# Patient Record
Sex: Male | Born: 1960 | Race: White | Hispanic: No | Marital: Married | Smoking: Current some day smoker
Health system: Southern US, Community
[De-identification: ages and names within clinical notes are randomized; demographics above are authoritative.]

## PROBLEM LIST (undated history)

## (undated) DIAGNOSIS — R55 Syncope and collapse: Secondary | ICD-10-CM

## (undated) DIAGNOSIS — N200 Calculus of kidney: Secondary | ICD-10-CM

## (undated) HISTORY — DX: Syncope and collapse: R55

## (undated) HISTORY — PX: LEG SURGERY: SHX1003

## (undated) HISTORY — PX: KNEE ARTHROSCOPY: SHX127

---

## 2002-09-25 ENCOUNTER — Emergency Department (HOSPITAL_COMMUNITY): Admission: EM | Admit: 2002-09-25 | Discharge: 2002-09-25 | Payer: Self-pay | Admitting: Emergency Medicine

## 2003-01-03 ENCOUNTER — Emergency Department (HOSPITAL_COMMUNITY): Admission: EM | Admit: 2003-01-03 | Discharge: 2003-01-03 | Payer: Self-pay | Admitting: Emergency Medicine

## 2003-08-25 ENCOUNTER — Emergency Department (HOSPITAL_COMMUNITY): Admission: EM | Admit: 2003-08-25 | Discharge: 2003-08-25 | Payer: Self-pay | Admitting: Emergency Medicine

## 2005-04-22 ENCOUNTER — Emergency Department (HOSPITAL_COMMUNITY): Admission: EM | Admit: 2005-04-22 | Discharge: 2005-04-22 | Payer: Self-pay | Admitting: Emergency Medicine

## 2006-07-18 ENCOUNTER — Emergency Department (HOSPITAL_COMMUNITY): Admission: EM | Admit: 2006-07-18 | Discharge: 2006-07-18 | Payer: Self-pay | Admitting: Family Medicine

## 2008-04-14 ENCOUNTER — Emergency Department (HOSPITAL_COMMUNITY): Admission: EM | Admit: 2008-04-14 | Discharge: 2008-04-14 | Payer: Self-pay | Admitting: Emergency Medicine

## 2008-12-23 ENCOUNTER — Ambulatory Visit: Payer: Self-pay | Admitting: Diagnostic Radiology

## 2008-12-23 ENCOUNTER — Emergency Department (HOSPITAL_BASED_OUTPATIENT_CLINIC_OR_DEPARTMENT_OTHER): Admission: EM | Admit: 2008-12-23 | Discharge: 2008-12-23 | Payer: Self-pay | Admitting: Emergency Medicine

## 2009-01-03 ENCOUNTER — Ambulatory Visit (HOSPITAL_COMMUNITY): Admission: RE | Admit: 2009-01-03 | Discharge: 2009-01-03 | Payer: Self-pay | Admitting: Orthopedic Surgery

## 2010-05-06 ENCOUNTER — Ambulatory Visit: Payer: Self-pay | Admitting: Interventional Radiology

## 2010-05-06 ENCOUNTER — Emergency Department (HOSPITAL_BASED_OUTPATIENT_CLINIC_OR_DEPARTMENT_OTHER): Admission: EM | Admit: 2010-05-06 | Discharge: 2010-05-06 | Payer: Self-pay | Admitting: Emergency Medicine

## 2010-11-08 LAB — CBC
HCT: 49.3 % (ref 39.0–52.0)
Hemoglobin: 16.8 g/dL (ref 13.0–17.0)
MCH: 32.8 pg (ref 26.0–34.0)
MCHC: 33.9 g/dL (ref 30.0–36.0)
MCV: 96.5 fL (ref 78.0–100.0)
Platelets: 224 10*3/uL (ref 150–400)
RBC: 5.11 MIL/uL (ref 4.22–5.81)
RDW: 12.4 % (ref 11.5–15.5)
WBC: 6.6 10*3/uL (ref 4.0–10.5)

## 2010-11-08 LAB — POCT CARDIAC MARKERS
CKMB, poc: <1 ng/mL — ABNORMAL LOW (ref 1.0–8.0)
CKMB, poc: <1 ng/mL — ABNORMAL LOW (ref 1.0–8.0)
Myoglobin, poc: 45.6 ng/mL (ref 12–200)
Myoglobin, poc: 68.3 ng/mL (ref 12–200)
Troponin i, poc: <0.05 ng/mL (ref 0.00–0.09)
Troponin i, poc: <0.05 ng/mL (ref 0.00–0.09)

## 2010-11-08 LAB — COMPREHENSIVE METABOLIC PANEL
ALT: 23 U/L (ref 0–53)
AST: 25 U/L (ref 0–37)
Albumin: 4.5 g/dL (ref 3.5–5.2)
Alkaline Phosphatase: 114 U/L (ref 39–117)
BUN: 12 mg/dL (ref 6–23)
CO2: 28 mEq/L (ref 19–32)
Calcium: 10 mg/dL (ref 8.4–10.5)
Chloride: 104 mEq/L (ref 96–112)
Creatinine, Ser: 1.1 mg/dL (ref 0.4–1.5)
GFR calc Af Amer: >60 mL/min (ref >60)
GFR calc non Af Amer: >60 mL/min (ref >60)
Glucose, Bld: 137 mg/dL — ABNORMAL HIGH (ref 70–99)
Potassium: 4.1 mEq/L (ref 3.5–5.1)
Sodium: 142 mEq/L (ref 135–145)
Total Bilirubin: 1 mg/dL (ref 0.3–1.2)
Total Protein: 7.7 g/dL (ref 6.0–8.3)

## 2010-11-08 LAB — DIFFERENTIAL
Basophils Absolute: 0.2 10*3/uL — ABNORMAL HIGH (ref 0.0–0.1)
Basophils Relative: 2 % — ABNORMAL HIGH (ref 0–1)
Eosinophils Absolute: 0.3 10*3/uL (ref 0.0–0.7)
Eosinophils Relative: 4 % (ref 0–5)
Lymphocytes Relative: 26 % (ref 12–46)
Lymphs Abs: 1.7 10*3/uL (ref 0.7–4.0)
Monocytes Absolute: 0.4 10*3/uL (ref 0.1–1.0)
Monocytes Relative: 5 % (ref 3–12)
Neutro Abs: 4 10*3/uL (ref 1.7–7.7)
Neutrophils Relative %: 62 % (ref 43–77)

## 2010-11-08 LAB — URINALYSIS, ROUTINE W REFLEX MICROSCOPIC
Bilirubin Urine: NEGATIVE
Glucose, UA: NEGATIVE mg/dL
Hgb urine dipstick: NEGATIVE
Ketones, ur: NEGATIVE mg/dL
Nitrite: NEGATIVE
Protein, ur: NEGATIVE mg/dL
Specific Gravity, Urine: 1.005 (ref 1.005–1.030)
Urobilinogen, UA: 0.2 mg/dL (ref 0.0–1.0)
pH: 6.5 (ref 5.0–8.0)

## 2011-09-22 ENCOUNTER — Encounter (HOSPITAL_BASED_OUTPATIENT_CLINIC_OR_DEPARTMENT_OTHER): Payer: Self-pay | Admitting: *Deleted

## 2011-09-22 ENCOUNTER — Emergency Department (HOSPITAL_BASED_OUTPATIENT_CLINIC_OR_DEPARTMENT_OTHER)
Admission: EM | Admit: 2011-09-22 | Discharge: 2011-09-22 | Disposition: A | Discharge status: Home / Self Care | Payer: 59 | Attending: Emergency Medicine | Admitting: Emergency Medicine

## 2011-09-22 ENCOUNTER — Emergency Department (INDEPENDENT_AMBULATORY_CARE_PROVIDER_SITE_OTHER): Payer: 59

## 2011-09-22 DIAGNOSIS — J321 Chronic frontal sinusitis: Secondary | ICD-10-CM | POA: Insufficient documentation

## 2011-09-22 DIAGNOSIS — H5710 Ocular pain, unspecified eye: Secondary | ICD-10-CM | POA: Insufficient documentation

## 2011-09-22 DIAGNOSIS — R6884 Jaw pain: Secondary | ICD-10-CM

## 2011-09-22 DIAGNOSIS — J329 Chronic sinusitis, unspecified: Secondary | ICD-10-CM

## 2011-09-22 DIAGNOSIS — R51 Headache: Secondary | ICD-10-CM

## 2011-09-22 DIAGNOSIS — J320 Chronic maxillary sinusitis: Secondary | ICD-10-CM

## 2011-09-22 DIAGNOSIS — R110 Nausea: Secondary | ICD-10-CM | POA: Insufficient documentation

## 2011-09-22 MED ORDER — OXYCODONE-ACETAMINOPHEN 5-325 MG PO TABS: 1.0000 | ORAL_TABLET | Freq: Four times a day (QID) | ORAL | Status: AC | PRN

## 2011-09-22 MED ORDER — AZITHROMYCIN 250 MG PO TABS: 250.0000 mg | ORAL_TABLET | Freq: Every day | ORAL | Status: AC

## 2011-09-22 MED ORDER — TETRACAINE HCL 0.5 % OP SOLN
OPHTHALMIC | Status: AC
  Administered 2011-09-22: 10:00:00
  Filled 2011-09-22: qty 2

## 2011-09-22 NOTE — ED Provider Notes (Signed)
History     CSN: [id number]  Arrival date & time 09/22/11  4098   First MD Initiated Contact with Patient 09/22/11 [phone]      Chief Complaint  Patient presents with  . Headache    (Consider location/radiation/quality/duration/timing/severity/associated sxs/prior treatment) Patient is a 51 y.o. male presenting with headaches. The history is provided by the patient.  Headache  This is a new problem. Episode onset: 3 days ago. The problem occurs constantly. The problem has not changed since onset.The headache is associated with nothing. Pain location: Behind the right eye. The quality of the pain is described as sharp and throbbing. The pain is at a severity of 8/10. The pain is severe. The pain does not radiate. Associated symptoms include anorexia and nausea. Pertinent negatives include no fever, no malaise/fatigue, no shortness of breath and no vomiting. He has tried acetaminophen, oral narcotic analgesics and NSAIDs for the symptoms. The treatment provided no relief.    No past medical history on file.  No past surgical history on file.  No family history on file.  History  Substance Use Topics  . Smoking status: Not on file  . Smokeless tobacco: Not on file  . Alcohol Use: Not on file      Review of Systems  Constitutional: Negative for fever and malaise/fatigue.  Respiratory: Negative for shortness of breath.   Gastrointestinal: Positive for nausea and anorexia. Negative for vomiting.  Neurological: Positive for headaches.  All other systems reviewed and are negative.    Allergies  Review of patient's allergies indicates not on file.  Home Medications  No current outpatient prescriptions on file.  BP 154/106  Pulse 93  Resp 18  SpO2 100%  Physical Exam  Nursing note and vitals reviewed. Constitutional: He is oriented to person, place, and time. He appears well-developed and well-nourished. No distress.  HENT:  Head: Normocephalic and atraumatic.    Mouth/Throat: Oropharynx is clear and moist.  Eyes: Conjunctivae, EOM and lids are normal. Pupils are equal, round, and reactive to light. Right conjunctiva is not injected. Left conjunctiva is not injected. Right eye exhibits normal extraocular motion and no nystagmus. Left eye exhibits normal extraocular motion and no nystagmus.  Fundoscopic exam:      The right eye shows no papilledema.       The left eye shows no papilledema.       Normal IOP on the right of between 13-18  Neck: Normal range of motion. Neck supple.  Cardiovascular: Normal rate, regular rhythm and intact distal pulses.   No murmur heard. Pulmonary/Chest: Effort normal and breath sounds normal. No respiratory distress. He has no wheezes. He has no rales.  Abdominal: Soft. He exhibits no distension. There is no tenderness. There is no rebound and no guarding.  Musculoskeletal: Normal range of motion. He exhibits no edema and no tenderness.  Neurological: He is alert and oriented to person, place, and time.  Skin: Skin is warm and dry. No rash noted. No erythema.  Psychiatric: His behavior is normal. His mood appears anxious.    ED Course  Procedures (including critical care time)  Labs Reviewed - No data to display Ct Head Wo Contrast  09/22/2011  *RADIOLOGY REPORT*  Clinical Data:  Right jaw pain and right eye pain.  CT HEAD WITHOUT CONTRAST CT MAXILLOFACIAL WITHOUT CONTRAST  Technique:  Multidetector CT imaging of the head and maxillofacial structures were performed using the standard protocol without intravenous contrast. Multiplanar CT image reconstructions of the maxillofacial structures  were also generated.  Comparison:  None  CT HEAD  Findings: No acute intracranial hemorrhage.  No focal mass lesion. No CT evidence of acute infarction.   No midline shift or mass effect.  No hydrocephalus.  Basilar cisterns are patent.  Mild mucosal thickening in the left maxillary sinus.  Scattered opacification ethmoid air cells and  extension of the left frontal sinuses.  Orbits appear normal.  No subperiosteal abscess.  IMPRESSION:  1.  No intracranial abnormality. 2.  Left maxillary and frontal sinusitis. 3.  Orbits are normal.  CT MAXILLOFACIAL  Findings:   There is mild mucosal thickening within the left and right maxillary sinus.  There is scattered opacification of ethmoid air cells.  Mild mucosal thickening in the left frontal sinus.  The orbits appear normal.  No subperiosteal abscess.  No abnormality of the left or right mandible.  No osseous erosions or sclerotic thickening.  The salivary glands are normal.  No significant adenopathy.  No evidence of abscess in the soft tissues of the face.  IMPRESSION:  1.  Mild maxillary sinusitis and left frontal sinusitis. 2.  No orbital abnormality. 3.  No evidence of abscess.  Original Report Authenticated By: Genevive Bi, M.D.   Ct Maxillofacial Wo Cm  09/22/2011  *RADIOLOGY REPORT*  Clinical Data:  Right jaw pain and right eye pain.  CT HEAD WITHOUT CONTRAST CT MAXILLOFACIAL WITHOUT CONTRAST  Technique:  Multidetector CT imaging of the head and maxillofacial structures were performed using the standard protocol without intravenous contrast. Multiplanar CT image reconstructions of the maxillofacial structures were also generated.  Comparison:  None  CT HEAD  Findings: No acute intracranial hemorrhage.  No focal mass lesion. No CT evidence of acute infarction.   No midline shift or mass effect.  No hydrocephalus.  Basilar cisterns are patent.  Mild mucosal thickening in the left maxillary sinus.  Scattered opacification ethmoid air cells and extension of the left frontal sinuses.  Orbits appear normal.  No subperiosteal abscess.  IMPRESSION:  1.  No intracranial abnormality. 2.  Left maxillary and frontal sinusitis. 3.  Orbits are normal.  CT MAXILLOFACIAL  Findings:   There is mild mucosal thickening within the left and right maxillary sinus.  There is scattered opacification of ethmoid  air cells.  Mild mucosal thickening in the left frontal sinus.  The orbits appear normal.  No subperiosteal abscess.  No abnormality of the left or right mandible.  No osseous erosions or sclerotic thickening.  The salivary glands are normal.  No significant adenopathy.  No evidence of abscess in the soft tissues of the face.  IMPRESSION:  1.  Mild maxillary sinusitis and left frontal sinusitis. 2.  No orbital abnormality. 3.  No evidence of abscess.  Original Report Authenticated By: Genevive Bi, M.D.     No diagnosis found.    MDM   Patient with a right-sided headache for the last 3 days has been unchanged despite him taking penicillin because he thought he may have a tooth problem and hydrocodone. On exam the patient is edentulous on the upper gums and there is a lower tooth that appears to have a dental caries but doubt that this is the cause for his symptoms. He denies any upper respiratory symptoms. He denies any neurovascular symptoms. He has normal vision and normal extraocular eye movements. Pupils are equal and reactive and intraocular pressure is 13-18 and normal.  Do not feel that it is eye related. Due to patient not having persistent headaches  will get a CT to further evaluate.   10:50 AM CT with sinusitis mostly in the frontal sinuses. We went back and discussed the results with the patient he said he has had a cold over the last 2 days despite denying it prior. He states he was taking Allegra to help the symptoms. He said he is having a lot of pain over his for head as well as the right eye. Will treat with azithromycin and have them return if symptoms worsen.     Gwyneth Sprout, MD 09/22/11 1051

## 2011-09-22 NOTE — ED Notes (Signed)
Patient c/o pain behind R eye, R jaw pain, took hydrocodone no relief

## 2012-02-08 ENCOUNTER — Encounter (HOSPITAL_BASED_OUTPATIENT_CLINIC_OR_DEPARTMENT_OTHER): Payer: Self-pay | Admitting: *Deleted

## 2012-02-08 ENCOUNTER — Emergency Department (HOSPITAL_BASED_OUTPATIENT_CLINIC_OR_DEPARTMENT_OTHER): Payer: 59

## 2012-02-08 ENCOUNTER — Emergency Department (HOSPITAL_BASED_OUTPATIENT_CLINIC_OR_DEPARTMENT_OTHER)
Admission: EM | Admit: 2012-02-08 | Discharge: 2012-02-08 | Disposition: A | Discharge status: Home / Self Care | Payer: 59 | Attending: Emergency Medicine | Admitting: Emergency Medicine

## 2012-02-08 DIAGNOSIS — R109 Unspecified abdominal pain: Secondary | ICD-10-CM | POA: Insufficient documentation

## 2012-02-08 DIAGNOSIS — M545 Low back pain, unspecified: Secondary | ICD-10-CM | POA: Insufficient documentation

## 2012-02-08 DIAGNOSIS — Z87442 Personal history of urinary calculi: Secondary | ICD-10-CM | POA: Insufficient documentation

## 2012-02-08 DIAGNOSIS — N200 Calculus of kidney: Secondary | ICD-10-CM

## 2012-02-08 DIAGNOSIS — M549 Dorsalgia, unspecified: Secondary | ICD-10-CM | POA: Insufficient documentation

## 2012-02-08 DIAGNOSIS — F17200 Nicotine dependence, unspecified, uncomplicated: Secondary | ICD-10-CM | POA: Insufficient documentation

## 2012-02-08 LAB — URINALYSIS, ROUTINE W REFLEX MICROSCOPIC
Bilirubin Urine: NEGATIVE
Glucose, UA: NEGATIVE mg/dL
Hgb urine dipstick: NEGATIVE
Ketones, ur: NEGATIVE mg/dL
Leukocytes, UA: NEGATIVE
Nitrite: NEGATIVE
Protein, ur: NEGATIVE mg/dL
Specific Gravity, Urine: 1.018 (ref 1.005–1.030)
Urobilinogen, UA: 1 mg/dL (ref 0.0–1.0)
pH: 7 (ref 5.0–8.0)

## 2012-02-08 MED ORDER — ORPHENADRINE CITRATE ER 100 MG PO TB12: 100.0000 mg | ORAL_TABLET | Freq: Two times a day (BID) | ORAL | Status: AC

## 2012-02-08 MED ORDER — OXYCODONE-ACETAMINOPHEN 5-325 MG PO TABS: 1.0000 | ORAL_TABLET | ORAL | Status: AC | PRN

## 2012-02-08 MED ORDER — NAPROXEN 500 MG PO TABS: 500.0000 mg | ORAL_TABLET | Freq: Two times a day (BID) | ORAL | Status: AC

## 2012-02-08 MED ORDER — IBUPROFEN 800 MG PO TABS
800.0000 mg | ORAL_TABLET | Freq: Once | ORAL | Status: AC
  Administered 2012-02-08: 800 mg via ORAL
  Filled 2012-02-08: qty 1

## 2012-02-08 NOTE — Discharge Instructions (Signed)
Back Pain, Adult Low back pain is very common. About 1 in 5 people have back pain.The cause of low back pain is rarely dangerous. The pain often gets better over time.About half of people with a sudden onset of back pain feel better in just 2 weeks. About 8 in 10 people feel better by 6 weeks.  CAUSES Some common causes of back pain include:  Strain of the muscles or ligaments supporting the spine.   Wear and tear (degeneration) of the spinal discs.   Arthritis.   Direct injury to the back.  DIAGNOSIS Most of the time, the direct cause of low back pain is not known.However, back pain can be treated effectively even when the exact cause of the pain is unknown.Answering your caregiver's questions about your overall health and symptoms is one of the most accurate ways to make sure the cause of your pain is not dangerous. If your caregiver needs more information, he or she may order lab work or imaging tests (X-rays or MRIs).However, even if imaging tests show changes in your back, this usually does not require surgery. HOME CARE INSTRUCTIONS For many people, back pain returns.Since low back pain is rarely dangerous, it is often a condition that people can learn to manageon their own.   Remain active. It is stressful on the back to sit or stand in one place. Do not sit, drive, or stand in one place for more than 30 minutes at a time. Take short walks on level surfaces as soon as pain allows.Try to increase the length of time you walk each day.   Do not stay in bed.Resting more than 1 or 2 days can delay your recovery.   Do not avoid exercise or work.Your body is made to move.It is not dangerous to be active, even though your back may hurt.Your back will likely heal faster if you return to being active before your pain is gone.   Pay attention to your body when you bend and lift. Many people have less discomfortwhen lifting if they bend their knees, keep the load close to their  bodies,and avoid twisting. Often, the most comfortable positions are those that put less stress on your recovering back.   Find a comfortable position to sleep. Use a firm mattress and lie on your side with your knees slightly bent. If you lie on your back, put a pillow under your knees.   Only take over-the-counter or prescription medicines as directed by your caregiver. Over-the-counter medicines to reduce pain and inflammation are often the most helpful.Your caregiver may prescribe muscle relaxant drugs.These medicines help dull your pain so you can more quickly return to your normal activities and healthy exercise.   Put ice on the injured area.   Put ice in a plastic bag.   Place a towel between your skin and the bag.   Leave the ice on for 15 to 20 minutes, 3 to 4 times a day for the first 2 to 3 days. After that, ice and heat may be alternated to reduce pain and spasms.   Ask your caregiver about trying back exercises and gentle massage. This may be of some benefit.   Avoid feeling anxious or stressed.Stress increases muscle tension and can worsen back pain.It is important to recognize when you are anxious or stressed and learn ways to manage it.Exercise is a great option.  SEEK MEDICAL CARE IF:  You have pain that is not relieved with rest or medicine.   You have   pain that does not improve in 1 week.   You have new symptoms.   You are generally not feeling well.  SEEK IMMEDIATE MEDICAL CARE IF:   You have pain that radiates from your back into your legs.   You develop new bowel or bladder control problems.   You have unusual weakness or numbness in your arms or legs.   You develop nausea or vomiting.   You develop abdominal pain.   You feel faint.  Document Released: 08/12/2005 Document Revised: 08/01/2011 Document Reviewed: 12/31/2010 [facility] Patient Information 2012 [place], [place].  Orphenadrine tablets What is this medicine? ORPHENADRINE (or FEN a dreen)  helps to relieve pain and stiffness in muscles and can treat muscle spasms. This medicine may be used for other purposes; ask your health care provider or pharmacist if you have questions. What should I tell my health care provider before I take this medicine? They need to know if you have any of these conditions: -glaucoma -heart disease -kidney disease -myasthenia gravis -peptic ulcer disease -prostate disease -stomach problems -an unusual or allergic reaction to orphenadrine, other medicines, foods, lactose, dyes, or preservatives -pregnant or trying to get pregnant -breast-feeding How should I use this medicine? Take this medicine by mouth with a full glass of water. Follow the directions on the prescription label. Take your medicine at regular intervals. Do not take your medicine more often than directed. Do not take more than you are told to take. Talk to your pediatrician regarding the use of this medicine in children. Special care may be needed. Patients over 49 years old may have a stronger reaction and need a smaller dose. Overdosage: If you think you have taken too much of this medicine contact a poison control center or emergency room at once. NOTE: This medicine is only for you. Do not share this medicine with others. What if I miss a dose? If you miss a dose, take it as soon as you can. If it is almost time for your next dose, take only that dose. Do not take double or extra doses. What may interact with this medicine? -alcohol -antihistamines -barbiturates, like phenobarbital -benzodiazepines -cyclobenzaprine -medicines for pain -phenothiazines like chlorpromazine, mesoridazine, prochlorperazine, thioridazine This list may not describe all possible interactions. Give your health care provider a list of all the medicines, herbs, non-prescription drugs, or dietary supplements you use. Also tell them if you smoke, drink alcohol, or use illegal drugs. Some items may interact  with your medicine. What should I watch for while using this medicine? Your mouth may get dry. Chewing sugarless gum or sucking hard candy, and drinking plenty of water may help. Contact your doctor if the problem does not go away or is severe. This medicine may cause dry eyes and blurred vision. If you wear contact lenses you may feel some discomfort. Lubricating drops may help. See your eye doctor if the problem does not go away or is severe. You may get drowsy or dizzy. Do not drive, use machinery, or do anything that needs mental alertness until you know how this medicine affects you. Do not stand or sit up quickly, especially if you are an older patient. This reduces the risk of dizzy or fainting spells. Alcohol may interfere with the effect of this medicine. Avoid alcoholic drinks. What side effects may I notice from receiving this medicine? Side effects that you should report to your doctor or health care professional as soon as possible: -allergic reactions like skin rash, itching or hives,  swelling of the face, lips, or tongue -changes in vision -difficulty breathing -fast heartbeat or palpitations -hallucinations -light headedness, fainting spells -vomiting Side effects that usually do not require medical attention (report to your doctor or health care professional if they continue or are bothersome): -dizziness -drowsiness -headache -nausea This list may not describe all possible side effects. Call your doctor for medical advice about side effects. You may report side effects to FDA at 1-800-FDA-1088. Where should I keep my medicine? Keep out of the reach of children. Store at room temperature between 15 and 30 degrees C (59 and 86 degrees F). Protect from light. Keep container tightly closed. Throw away any unused medicine after the expiration date. NOTE: This sheet is a summary. It may not cover all possible information. If you have questions about this medicine, talk to your  doctor, pharmacist, or health care provider.  2012, [employer]. (03/08/2008 5:19:12 PM)  Naproxen and naproxen sodium oral immediate-release tablets What is this medicine? NAPROXEN (na PROX en) is a non-steroidal anti-inflammatory drug (NSAID). It is used to reduce swelling and to treat pain. This medicine may be used for dental pain, headache, or painful monthly periods. It is also used for painful joint and muscular problems such as arthritis, tendinitis, bursitis, and gout. This medicine may be used for other purposes; ask your health care provider or pharmacist if you have questions. What should I tell my health care provider before I take this medicine? They need to know if you have any of these conditions: -asthma -cigarette smoker -drink more than 3 alcohol containing drinks a day -heart disease or circulation problems such as heart failure or leg edema (fluid retention) -high blood pressure -kidney disease -liver disease -stomach bleeding or ulcers -an unusual or allergic reaction to naproxen, aspirin, other NSAIDs, other medicines, foods, dyes, or preservatives -pregnant or trying to get pregnant -breast-feeding How should I use this medicine? Take this medicine by mouth with a glass of water. Follow the directions on the prescription label. Take it with food if your stomach gets upset. Try to not lie down for at least 10 minutes after you take it. Take your medicine at regular intervals. Do not take your medicine more often than directed. Long-term, continuous use may increase the risk of heart attack or stroke. A special MedGuide will be given to you by the pharmacist with each prescription and refill. Be sure to read this information carefully each time. Talk to your pediatrician regarding the use of this medicine in children. Special care may be needed. Overdosage: If you think you have taken too much of this medicine contact a poison control center or emergency room  at once. NOTE: This medicine is only for you. Do not share this medicine with others. What if I miss a dose? If you miss a dose, take it as soon as you can. If it is almost time for your next dose, take only that dose. Do not take double or extra doses. What may interact with this medicine? -alcohol -aspirin -cidofovir -diuretics -lithium -methotrexate -other drugs for inflammation like ketorolac or prednisone -pemetrexed -probenecid -warfarin This list may not describe all possible interactions. Give your health care provider a list of all the medicines, herbs, non-prescription drugs, or dietary supplements you use. Also tell them if you smoke, drink alcohol, or use illegal drugs. Some items may interact with your medicine. What should I watch for while using this medicine? Tell your doctor or health care professional if your pain  does not get better. Talk to your doctor before taking another medicine for pain. Do not treat yourself. This medicine does not prevent heart attack or stroke. In fact, this medicine may increase the chance of a heart attack or stroke. The chance may increase with longer use of this medicine and in people who have heart disease. If you take aspirin to prevent heart attack or stroke, talk with your doctor or health care professional. Do not take other medicines that contain aspirin, ibuprofen, or naproxen with this medicine. Side effects such as stomach upset, nausea, or ulcers may be more likely to occur. Many medicines available without a prescription should not be taken with this medicine. This medicine can cause ulcers and bleeding in the stomach and intestines at any time during treatment. Do not smoke cigarettes or drink alcohol. These increase irritation to your stomach and can make it more susceptible to damage from this medicine. Ulcers and bleeding can happen without warning symptoms and can cause death. You may get drowsy or dizzy. Do not drive, use  machinery, or do anything that needs mental alertness until you know how this medicine affects you. Do not stand or sit up quickly, especially if you are an older patient. This reduces the risk of dizzy or fainting spells. This medicine can cause you to bleed more easily. Try to avoid damage to your teeth and gums when you brush or floss your teeth. What side effects may I notice from receiving this medicine? Side effects that you should report to your doctor or health care professional as soon as possible: -black or bloody stools, blood in the urine or vomit -blurred vision -chest pain -difficulty breathing or wheezing -nausea or vomiting -severe stomach pain -skin rash, skin redness, blistering or peeling skin, hives, or itching -slurred speech or weakness on one side of the body -swelling of eyelids, throat, lips -unexplained weight gain or swelling -unusually weak or tired -yellowing of eyes or skin Side effects that usually do not require medical attention (report to your doctor or health care professional if they continue or are bothersome): -constipation -headache -heartburn This list may not describe all possible side effects. Call your doctor for medical advice about side effects. You may report side effects to FDA at 1-800-FDA-1088. Where should I keep my medicine? Keep out of the reach of children. Store at room temperature between 15 and 30 degrees C (59 and 86 degrees F). Keep container tightly closed. Throw away any unused medicine after the expiration date. NOTE: This sheet is a summary. It may not cover all possible information. If you have questions about this medicine, talk to your doctor, pharmacist, or health care provider.  2012, [employer]. (08/14/2009 8:10:16 PM)  Acetaminophen; Oxycodone tablets What is this medicine? ACETAMINOPHEN; OXYCODONE (a set a MEE noe fen; ox i KOE done) is a pain reliever. It is used to treat mild to moderate pain. This  medicine may be used for other purposes; ask your health care provider or pharmacist if you have questions. What should I tell my health care provider before I take this medicine? They need to know if you have any of these conditions: -brain tumor -Crohn's disease, inflammatory bowel disease, or ulcerative colitis -drink more than 3 alcohol containing drinks per day -drug abuse or addiction -head injury -heart or circulation problems -kidney disease or problems going to the bathroom -liver disease -lung disease, asthma, or breathing problems -an unusual or allergic reaction to acetaminophen, oxycodone, other opioid analgesics,  other medicines, foods, dyes, or preservatives -pregnant or trying to get pregnant -breast-feeding How should I use this medicine? Take this medicine by mouth with a full glass of water. Follow the directions on the prescription label. Take your medicine at regular intervals. Do not take your medicine more often than directed. Talk to your pediatrician regarding the use of this medicine in children. Special care may be needed. Patients over 70 years old may have a stronger reaction and need a smaller dose. Overdosage: If you think you have taken too much of this medicine contact a poison control center or emergency room at once. NOTE: This medicine is only for you. Do not share this medicine with others. What if I miss a dose? If you miss a dose, take it as soon as you can. If it is almost time for your next dose, take only that dose. Do not take double or extra doses. What may interact with this medicine? -alcohol or medicines that contain alcohol -antihistamines -barbiturates like amobarbital, butalbital, butabarbital, methohexital, pentobarbital, phenobarbital, thiopental, and secobarbital -benztropine -drugs for bladder problems like solifenacin, trospium, oxybutynin, tolterodine, hyoscyamine, and methscopolamine -drugs for breathing problems like ipratropium  and tiotropium -drugs for certain stomach or intestine problems like propantheline, homatropine methylbromide, glycopyrrolate, atropine, belladonna, and dicyclomine -general anesthetics like etomidate, ketamine, nitrous oxide, propofol, desflurane, enflurane, halothane, isoflurane, and sevoflurane -medicines for depression, anxiety, or psychotic disturbances -medicines for pain like codeine, morphine, pentazocine, buprenorphine, butorphanol, nalbuphine, tramadol, and propoxyphene -medicines for sleep -muscle relaxants -naltrexone -phenothiazines like perphenazine, thioridazine, chlorpromazine, mesoridazine, fluphenazine, prochlorperazine, promazine, and trifluoperazine -scopolamine -trihexyphenidyl This list may not describe all possible interactions. Give your health care provider a list of all the medicines, herbs, non-prescription drugs, or dietary supplements you use. Also tell them if you smoke, drink alcohol, or use illegal drugs. Some items may interact with your medicine. What should I watch for while using this medicine? Tell your doctor or health care professional if your pain does not go away, if it gets worse, or if you have new or a different type of pain. You may develop tolerance to the medicine. Tolerance means that you will need a higher dose of the medication for pain relief. Tolerance is normal and is expected if you take this medicine for a long time. Do not suddenly stop taking your medicine because you may develop a severe reaction. Your body becomes used to the medicine. This does NOT mean you are addicted. Addiction is a behavior related to getting and using a drug for a nonmedical reason. If you have pain, you have a medical reason to take pain medicine. Your doctor will tell you how much medicine to take. If your doctor wants you to stop the medicine, the dose will be slowly lowered over time to avoid any side effects. You may get drowsy or dizzy. Do not drive, use machinery,  or do anything that needs mental alertness until you know how this medicine affects you. Do not stand or sit up quickly, especially if you are an older patient. This reduces the risk of dizzy or fainting spells. Alcohol may interfere with the effect of this medicine. Avoid alcoholic drinks. The medicine will cause constipation. Try to have a bowel movement at least every 2 to 3 days. If you do not have a bowel movement for 3 days, call your doctor or health care professional. Do not take Tylenol (acetaminophen) or medicines that have acetaminophen with this medicine. Too much acetaminophen can be very dangerous.  Many nonprescription medicines contain acetaminophen. Always read the labels carefully to avoid taking more acetaminophen. What side effects may I notice from receiving this medicine? Side effects that you should report to your doctor or health care professional as soon as possible: -allergic reactions like skin rash, itching or hives, swelling of the face, lips, or tongue -breathing difficulties, wheezing -confusion -light headedness or fainting spells -severe stomach pain -yellowing of the skin or the whites of the eyes Side effects that usually do not require medical attention (report to your doctor or health care professional if they continue or are bothersome): -dizziness -drowsiness -nausea -vomiting This list may not describe all possible side effects. Call your doctor for medical advice about side effects. You may report side effects to FDA at 1-800-FDA-1088. Where should I keep my medicine? Keep out of the reach of children. This medicine can be abused. Keep your medicine in a safe place to protect it from theft. Do not share this medicine with anyone. Selling or giving away this medicine is dangerous and against the law. Store at room temperature between 20 and 25 degrees C (68 and 77 degrees F). Keep container tightly closed. Protect from light. Flush any unused medicines down  the toilet. Do not use the medicine after the expiration date. NOTE: This sheet is a summary. It may not cover all possible information. If you have questions about this medicine, talk to your doctor, pharmacist, or health care provider.  2012, [employer]. (07/11/2008 10:01:21 AM)

## 2012-02-08 NOTE — ED Provider Notes (Signed)
History     CSN: [id number]  Arrival date & time 02/08/12  4098   First MD Initiated Contact with Patient 02/08/12 1015      Chief Complaint  Patient presents with  . Back Pain    (Consider location/radiation/quality/duration/timing/severity/associated sxs/prior treatment) HPI 51 year old male complains of pain in the left flank for the last 2 days. Pain is moderate in intensity and he rates it at a 5/10. It is sharp and does not radiate. There is no associated nausea, vomiting, fever, chills, dysuria. He denies any numbness or tingling or weakness. He is taking some over-the-counter medication list is also on his tongue but with no relief. Pain is worse when he lays flat but nothing makes it any better. He does remember lifting a box of torches but does not recall injuring himself at that time. He has a history of kidney stones but present remember this pain is similar to the kidney stones or not. History reviewed. No pertinent past medical history.  Past Surgical History  Procedure Date  . Leg surgery     right    History reviewed. No pertinent family history.  History  Substance Use Topics  . Smoking status: Current Some Day Smoker  . Smokeless tobacco: Not on file  . Alcohol Use: Yes      Review of Systems  Allergies  Review of patient's allergies indicates no known allergies.  Home Medications   Current Outpatient Rx  Name Route Sig Dispense Refill  . HYDROCODONE-ACETAMINOPHEN PO Oral Take by mouth.      BP 122/83  Pulse 80  Temp 98.5 F (36.9 C) (Oral)  Resp 20  SpO2 99%  Physical Exam 51 year old male is resting comfortably and in no acute distress. Vital signs are normal. Oxygen saturation is 99% which is normal. Head is normocephalic and atraumatic. PERRLA LA, EOMI. Neck is nontender and supple. Back has moderate left CVA tenderness, no midline tenderness. Negative straight leg raise. Heart has regular rate rhythm without murmur. Abdomen is soft, flat,  nontender without masses or hepatosplenomegaly. Extremities have full range of motion, no cyanosis or edema. Skin is warm and dry without rash. Neurologic: Mental status is normal, cranial nerves are intact, there are no motor or sensory deficits. ED Course  Procedures (including critical care time)  Results for orders placed during the hospital encounter of 02/08/12  URINALYSIS, ROUTINE W REFLEX MICROSCOPIC      Component Value Range   Color, Urine YELLOW  YELLOW   APPearance CLEAR  CLEAR   Specific Gravity, Urine 1.018  1.005 - 1.030   pH 7.0  5.0 - 8.0   Glucose, UA NEGATIVE  NEGATIVE mg/dL   Hgb urine dipstick NEGATIVE  NEGATIVE   Bilirubin Urine NEGATIVE  NEGATIVE   Ketones, ur NEGATIVE  NEGATIVE mg/dL   Protein, ur NEGATIVE  NEGATIVE mg/dL   Urobilinogen, UA 1.0  0.0 - 1.0 mg/dL   Nitrite NEGATIVE  NEGATIVE   Leukocytes, UA NEGATIVE  NEGATIVE   Ct Abdomen Pelvis Wo Contrast  02/08/2012  *RADIOLOGY REPORT*  Clinical Data: Back pain  CT ABDOMEN AND PELVIS WITHOUT CONTRAST  Technique:  Multidetector CT imaging of the abdomen and pelvis was performed following the standard protocol without intravenous contrast.  Comparison: 08/25/2003  Findings: Lung bases are clear.  Unenhanced liver, spleen, pancreas, and adrenal glands are unremarkable.  Gallbladder is contracted.  No intrahepatic or extrahepatic ductal dilatation.  At least four right and three left nonobstructing renal calculi, measuring up to  4 mm in the right upper pole and left lower pole. No hydronephrosis.  No evidence of bowel obstruction.  Atherosclerotic calcifications of the abdominal aorta and branch vessels.  No abdominopelvic ascites.  No suspicious abdominopelvic lymphadenopathy.  Prostate is unremarkable.  No ureteral or bladder calculi.  Degenerative changes of the visualized thoracolumbar spine.  IMPRESSION: Multiple nodules renal calculi, as described above, measuring up to 4 mm.  No ureteral or bladder calculi.  No  hydronephrosis.  Original Report Authenticated By: Charline Bills, M.D.      1. Low back pain   2. Nephrolithiasis       MDM  Left flank pain which seems most likely musculoskeletal. However, he does have a history of kidney stones so urinalysis and CT scan will be obtained to rule out ureterolithiasis. He is given a dose of ibuprofen for pain. Note is made of triage note stating right lower back pain. I have confirmed with the patient is definitely his left side of his hurting.   CT scan shows renal calculi but no ureteral calculi. Urinalysis is normal. Hand was treated in the ED with a dose of ibuprofen and sent home with prescriptions for naproxen, orphenadrine, and Percocet.     Dione Booze, MD 02/08/12 [phone]

## 2012-02-08 NOTE — ED Notes (Signed)
Patient c/o R lower back pain since Thursday, took OTC meds but no relief

## 2015-07-09 ENCOUNTER — Encounter (HOSPITAL_COMMUNITY): Payer: Self-pay | Admitting: Emergency Medicine

## 2015-07-09 ENCOUNTER — Emergency Department (HOSPITAL_COMMUNITY): Payer: 59

## 2015-07-09 ENCOUNTER — Emergency Department (HOSPITAL_COMMUNITY)
Admission: EM | Admit: 2015-07-09 | Discharge: 2015-07-09 | Disposition: A | Discharge status: Home / Self Care | Payer: 59 | Attending: Emergency Medicine | Admitting: Emergency Medicine

## 2015-07-09 DIAGNOSIS — R61 Generalized hyperhidrosis: Secondary | ICD-10-CM | POA: Diagnosis not present

## 2015-07-09 DIAGNOSIS — F17210 Nicotine dependence, cigarettes, uncomplicated: Secondary | ICD-10-CM | POA: Insufficient documentation

## 2015-07-09 DIAGNOSIS — R0602 Shortness of breath: Secondary | ICD-10-CM | POA: Insufficient documentation

## 2015-07-09 DIAGNOSIS — R079 Chest pain, unspecified: Secondary | ICD-10-CM | POA: Diagnosis present

## 2015-07-09 LAB — CBC WITH DIFFERENTIAL/PLATELET
Basophils Absolute: 0 10*3/uL (ref 0.0–0.1)
Basophils Relative: 1 %
Eosinophils Absolute: 0.3 10*3/uL (ref 0.0–0.7)
Eosinophils Relative: 4 %
HCT: 45 % (ref 39.0–52.0)
Hemoglobin: 15.6 g/dL (ref 13.0–17.0)
Lymphocytes Relative: 24 %
Lymphs Abs: 1.7 10*3/uL (ref 0.7–4.0)
MCH: 33.2 pg (ref 26.0–34.0)
MCHC: 34.7 g/dL (ref 30.0–36.0)
MCV: 95.7 fL (ref 78.0–100.0)
Monocytes Absolute: 0.5 10*3/uL (ref 0.1–1.0)
Monocytes Relative: 7 %
Neutro Abs: 4.8 10*3/uL (ref 1.7–7.7)
Neutrophils Relative %: 64 %
Platelets: 224 10*3/uL (ref 150–400)
RBC: 4.7 MIL/uL (ref 4.22–5.81)
RDW: 13 % (ref 11.5–15.5)
WBC: 7.3 10*3/uL (ref 4.0–10.5)

## 2015-07-09 LAB — HEPATIC FUNCTION PANEL
ALBUMIN: 4 g/dL (ref 3.5–5.0)
ALT: 24 U/L (ref 17–63)
AST: 25 U/L (ref 15–41)
Alkaline Phosphatase: 93 U/L (ref 38–126)
BILIRUBIN DIRECT: 0.2 mg/dL (ref 0.1–0.5)
Indirect Bilirubin: 0.5 mg/dL (ref 0.3–0.9)
TOTAL PROTEIN: 6.7 g/dL (ref 6.5–8.1)
Total Bilirubin: 0.7 mg/dL (ref 0.3–1.2)

## 2015-07-09 LAB — BASIC METABOLIC PANEL
Anion gap: 6 (ref 5–15)
BUN: 11 mg/dL (ref 6–20)
CO2: 28 mmol/L (ref 22–32)
Calcium: 9.5 mg/dL (ref 8.9–10.3)
Chloride: 103 mmol/L (ref 101–111)
Creatinine, Ser: 1.21 mg/dL (ref 0.61–1.24)
GFR calc Af Amer: >60 mL/min (ref >60)
GFR calc non Af Amer: >60 mL/min (ref >60)
Glucose, Bld: 145 mg/dL — ABNORMAL HIGH (ref 65–99)
Potassium: 4.1 mmol/L (ref 3.5–5.1)
Sodium: 137 mmol/L (ref 135–145)

## 2015-07-09 LAB — D-DIMER, QUANTITATIVE (NOT AT ARMC)

## 2015-07-09 LAB — I-STAT TROPONIN, ED
Troponin i, poc: 0 ng/mL (ref 0.00–0.08)
Troponin i, poc: 0.01 ng/mL (ref 0.00–0.08)

## 2015-07-09 MED ORDER — NITROGLYCERIN 2 % TD OINT
1.0000 [in_us] | TOPICAL_OINTMENT | Freq: Once | TRANSDERMAL | Status: AC
  Administered 2015-07-09: 1 [in_us] via TOPICAL
  Filled 2015-07-09: qty 1

## 2015-07-09 MED ORDER — OXYCODONE-ACETAMINOPHEN 5-325 MG PO TABS
1.0000 | ORAL_TABLET | Freq: Once | ORAL | Status: AC
  Administered 2015-07-09: 1 via ORAL
  Filled 2015-07-09: qty 1

## 2015-07-09 NOTE — ED Notes (Signed)
Pt reports CP and shortness of breath starting at 10 pm last night. Pt attempted to sleep it off. Pt woke up at about 330 with the chest pain again. Pt describes the chest pain as soreness and squeezing. 5/10 chest pain currently. Pt stated that his chest pain was a 10/10. Symptoms presented with N/V and SOB, and sweating.   Pt stated he took 4 500 mg tylenol approx 1 hr ago.

## 2015-07-09 NOTE — ED Notes (Signed)
Pt verbalized understanding of d/c instructions, prescriptions, and follow-up care. No further questions/concerns, VSS, ambulatory w/ steady gait (refused wheelchair) 

## 2015-07-09 NOTE — ED Provider Notes (Signed)
CSN: [id number]     Arrival date & time 07/09/15  0430 History   First MD Initiated Contact with Patient 07/09/15 [id number]     Chief Complaint  Patient presents with  . Chest Pain     (Consider location/radiation/quality/duration/timing/severity/associated sxs/prior Treatment) HPI  This a 54 year old smoker who presents with left-sided chest pain. Patient reports onset of symptoms last night at approximately 10 PM. He fell asleep and woke back up with chest pain at 3:30 AM. He describes as a soreness. Currently 5 out of 10. Nothing seems to make it better or worse. Patient reports associated shortness of breath and diaphoresis. Patient reports he has had symptoms similar to this in the past but has not seen a doctor. Denies any recent travel or leg swelling. Denies any cough or fevers. Does report early family history of heart disease. Denies hypertension, hyperlipidemia, or diabetes.  History reviewed. No pertinent past medical history. Past Surgical History  Procedure Laterality Date  . Leg surgery      right   No family history on file. Social History  Substance Use Topics  . Smoking status: Current Some Day Smoker -- 0.50 packs/day for 42 years    Types: Cigarettes  . Smokeless tobacco: None  . Alcohol Use: Yes    Review of Systems  Constitutional: Positive for diaphoresis. Negative for fever.  Respiratory: Positive for shortness of breath. Negative for chest tightness.   Cardiovascular: Positive for chest pain. Negative for leg swelling.  Gastrointestinal: Negative.  Negative for nausea, vomiting and abdominal pain.  Genitourinary: Negative.  Negative for dysuria.  Neurological: Negative for headaches.  All other systems reviewed and are negative.     Allergies  Review of patient's allergies indicates no known allergies.  Home Medications   Prior to Admission medications   Medication Sig Start Date End Date Taking? Authorizing Provider  HYDROCODONE-ACETAMINOPHEN PO  Take by mouth.    Historical Provider, MD   BP 134/84 mmHg  Pulse 63  Temp(Src) 97.6 F (36.4 C) (Oral)  Resp 15  Ht 5\' 9"  (1.753 m)  Wt 160 lb (72.576 kg)  BMI 23.62 kg/m2  SpO2 100% Physical Exam  Constitutional: He is oriented to person, place, and time. He appears well-developed and well-nourished. No distress.  Smells of smoke  HENT:  Head: Normocephalic and atraumatic.  Cardiovascular: Normal rate, regular rhythm and normal heart sounds.   No murmur heard. Pulmonary/Chest: Effort normal and breath sounds normal. No respiratory distress. He has no wheezes.  Abdominal: Soft. Bowel sounds are normal. There is no tenderness. There is no rebound.  Musculoskeletal: He exhibits no edema.  Neurological: He is alert and oriented to person, place, and time.  Skin: Skin is warm and dry.  Psychiatric: He has a normal mood and affect.  Nursing note and vitals reviewed.   ED Course  Procedures (including critical care time) Labs Review Labs Reviewed  BASIC METABOLIC PANEL - Abnormal; Notable for the following:    Glucose, Bld 145 (*)    All other components within normal limits  CBC WITH DIFFERENTIAL/PLATELET  D-DIMER, QUANTITATIVE (NOT AT [facility])  Rosezena SensorI-STAT TROPOININ, ED    Imaging Review Dg Chest 2 View  07/09/2015  CLINICAL DATA:  Initial evaluation for acute chest pain. EXAM: CHEST  2 VIEW COMPARISON:  Prior radiograph from on 05/06/2010 FINDINGS: The cardiac and mediastinal silhouettes are stable in size and contour, and remain within normal limits. The lungs are normally inflated. No airspace consolidation, pleural effusion, or pulmonary edema  is identified. There is no pneumothorax. No acute osseous abnormality identified. IMPRESSION: No active cardiopulmonary disease. Electronically Signed   By: Rise Mu M.D.   On: 07/09/2015 05:04   I have personally reviewed and evaluated these images and lab results as part of my medical decision-making.   EKG  Interpretation   Date/Time:  Sunday July 09 2015 04:36:03 EST Ventricular Rate:  59 PR Interval:  167 QRS Duration: 76 QT Interval:  383 QTC Calculation: 379 R Axis:   82 Text Interpretation:  Sinus rhythm Minimal elevation V3 Confirmed by  Janney Priego  MD, Otelia Hettinger ([id number]) on 07/09/2015 4:41:49 AM      MDM   Final diagnoses:  None    Patient presents with chest pain. Ongoing since last night. Description of pain is somewhat concerning for ACS; however, there is no exertional component. Heart Score 3.  EKG is nonischemic and unchanged from prior. Initial workup is reassuring including troponin and d-dimer. Will hold patient for a delta troponin. If repeat troponin is negative, will have patient follow up closely in cardiology clinic.  Signed out to oncoming doctor.    Shon Baton, MD 07/09/15 [phone]

## 2015-07-09 NOTE — Discharge Instructions (Signed)
We gave you a number for cardiologist in the area. It is important that you've call him tomorrow morning to follow up with you very soon.

## 2021-03-09 ENCOUNTER — Encounter (HOSPITAL_COMMUNITY): Payer: Self-pay | Admitting: Emergency Medicine

## 2021-03-09 ENCOUNTER — Emergency Department (HOSPITAL_COMMUNITY)
Admission: EM | Admit: 2021-03-09 | Discharge: 2021-03-09 | Discharge status: Against Medical Advice | Payer: PRIVATE HEALTH INSURANCE | Attending: Emergency Medicine | Admitting: Emergency Medicine

## 2021-03-09 ENCOUNTER — Emergency Department (HOSPITAL_COMMUNITY): Payer: PRIVATE HEALTH INSURANCE

## 2021-03-09 ENCOUNTER — Other Ambulatory Visit: Payer: Self-pay

## 2021-03-09 DIAGNOSIS — N390 Urinary tract infection, site not specified: Secondary | ICD-10-CM | POA: Diagnosis not present

## 2021-03-09 DIAGNOSIS — F17210 Nicotine dependence, cigarettes, uncomplicated: Secondary | ICD-10-CM | POA: Diagnosis not present

## 2021-03-09 DIAGNOSIS — R109 Unspecified abdominal pain: Secondary | ICD-10-CM

## 2021-03-09 LAB — BASIC METABOLIC PANEL
Anion gap: 10 (ref 5–15)
BUN: 10 mg/dL (ref 6–20)
CO2: 27 mmol/L (ref 22–32)
Calcium: 9.7 mg/dL (ref 8.9–10.3)
Chloride: 101 mmol/L (ref 98–111)
Creatinine, Ser: 0.96 mg/dL (ref 0.61–1.24)
GFR, Estimated: >60 mL/min (ref >60)
Glucose, Bld: 119 mg/dL — ABNORMAL HIGH (ref 70–99)
Potassium: 4 mmol/L (ref 3.5–5.1)
Sodium: 138 mmol/L (ref 135–145)

## 2021-03-09 LAB — URINALYSIS, ROUTINE W REFLEX MICROSCOPIC
Bilirubin Urine: NEGATIVE
Glucose, UA: NEGATIVE mg/dL
Ketones, ur: NEGATIVE mg/dL
Nitrite: POSITIVE — AB
Protein, ur: 30 mg/dL — AB
Specific Gravity, Urine: 1.016 (ref 1.005–1.030)
pH: 8 (ref 5.0–8.0)

## 2021-03-09 LAB — CBC
HCT: 44.2 % (ref 39.0–52.0)
Hemoglobin: 15.3 g/dL (ref 13.0–17.0)
MCH: 32.6 pg (ref 26.0–34.0)
MCHC: 34.6 g/dL (ref 30.0–36.0)
MCV: 94.2 fL (ref 80.0–100.0)
Platelets: 239 10*3/uL (ref 150–400)
RBC: 4.69 MIL/uL (ref 4.22–5.81)
RDW: 12.4 % (ref 11.5–15.5)
WBC: 11.7 10*3/uL — ABNORMAL HIGH (ref 4.0–10.5)
nRBC: 0 % (ref 0.0–0.2)

## 2021-03-09 MED ORDER — ACETAMINOPHEN 500 MG PO TABS
1000.0000 mg | ORAL_TABLET | Freq: Once | ORAL | Status: AC
  Administered 2021-03-09: 1000 mg via ORAL
  Filled 2021-03-09: qty 2

## 2021-03-09 MED ORDER — CEPHALEXIN 500 MG PO CAPS: 500.0000 mg | ORAL_CAPSULE | Freq: Three times a day (TID) | ORAL | 0 refills | Status: DC

## 2021-03-09 NOTE — ED Triage Notes (Signed)
Pt reports rt sided flank pain that has gotten worse over the last 3 days. Pt reports nausea, oliguria and dysuria. Also mid abd pain. Denies v/d or fevers.

## 2021-03-09 NOTE — ED Provider Notes (Signed)
[facility] EMERGENCY DEPARTMENT Provider Note   CSN: [id number] Arrival date & time: 03/09/21  1126     History Chief Complaint  Patient presents with   Flank Pain    Larry Sparks is a 60 y.o. male.   Flank Pain  60 year old male PMHx prostatic enlargement, nephrolithiasis, presenting for right-sided flank pain.  Onset 3 days ago, severe, worsening, improved w/ heating pad.  No significant improvement with taking 10+ Advil tabs per day.  Associated symptoms include nausea, oliguria, dysuria, mid abdominal pain. Had some sweats 2d ago. Has additionally had constipation x4d, started having BM's today after taking milk of magnesia this am. Had some CP last week, left sternal, improved w/ massage, "pounding on chest", and w/ warm compress. Has been having this pain intermittently x1y. Immunizations utd.  No further medical concerns time including fevers, chills, palpitations, sob, cough, vomiting, diarrhea, dysuria, hematuria, HA, focal paresthesias/weakness, seizure, syncope, rash.  History obtained from patient, SO, chart review.    History reviewed. No pertinent past medical history.  There are no problems to display for this patient.   Past Surgical History:  Procedure Laterality Date   LEG SURGERY     right       No family history on file.  Social History   Tobacco Use   Smoking status: Some Days    Packs/day: 0.50    Years: 42.00    Pack years: 21.00    Types: Cigarettes   Smokeless tobacco: Never  Substance Use Topics   Alcohol use: Yes   Drug use: Yes    Types: Marijuana    Home Medications Prior to Admission medications   Medication Sig Start Date End Date Taking? Authorizing Provider  cephALEXin (KEFLEX) 500 MG capsule Take 1 capsule (500 mg total) by mouth 3 (three) times daily for 7 days. 03/09/21 03/16/21 Yes Jessie Schrieber, MD  ibuprofen (ADVIL) 200 MG tablet Take 1,000 mg by mouth 2 (two) times daily.   Yes [provider]    Allergies    Patient has no known allergies.  Review of Systems   Review of Systems  Genitourinary:  Positive for flank pain.  All other systems reviewed and are negative.  Physical Exam Updated Vital Signs BP (!) 151/80   Pulse 84   Temp 98.2 F (36.8 C) (Oral)   Resp (!) 30   Ht 5\' 7"  (1.702 m)   Wt 72.6 kg   SpO2 96%   BMI 25.06 kg/m   Physical Exam Vitals and nursing note reviewed.  Constitutional:      Appearance: He is well-developed.  HENT:     Head: Normocephalic and atraumatic.     Mouth/Throat:     Mouth: Mucous membranes are moist.     Pharynx: Oropharynx is clear.  Eyes:     Conjunctiva/sclera: Conjunctivae normal.  Cardiovascular:     Rate and Rhythm: Normal rate and regular rhythm.     Heart sounds: No murmur heard. Pulmonary:     Effort: Pulmonary effort is normal. No respiratory distress.     Breath sounds: Normal breath sounds.  Abdominal:     General: There is no distension.     Palpations: Abdomen is soft.     Tenderness: There is abdominal tenderness (suprapubic). There is right CVA tenderness. There is no left CVA tenderness, guarding or rebound.  Musculoskeletal:     Cervical back: Neck supple.     Right lower leg: No edema.  Left lower leg: No edema.  Skin:    General: Skin is warm and dry.  Neurological:     General: No focal deficit present.     Mental Status: He is alert and oriented to person, place, and time.  Psychiatric:        Mood and Affect: Mood normal.        Behavior: Behavior normal.    ED Results / Procedures / Treatments   Labs (all labs ordered are listed, but only abnormal results are displayed) Labs Reviewed  URINALYSIS, ROUTINE W REFLEX MICROSCOPIC - Abnormal; Notable for the following components:      Result Value   APPearance CLOUDY (*)    Hgb urine dipstick SMALL (*)    Protein, ur 30 (*)    Nitrite POSITIVE (*)    Leukocytes,Ua LARGE (*)    Bacteria, UA MANY (*)    All other  components within normal limits  BASIC METABOLIC PANEL - Abnormal; Notable for the following components:   Glucose, Bld 119 (*)    All other components within normal limits  CBC - Abnormal; Notable for the following components:   WBC 11.7 (*)    All other components within normal limits  URINE CULTURE    EKG None  Radiology No results found.  Procedures Procedures   Medications Ordered in ED Medications  acetaminophen (TYLENOL) tablet 1,000 mg (1,000 mg Oral Given 03/09/21 1656)    ED Course  I have reviewed the triage vital signs and the nursing notes.  Pertinent labs & imaging results that were available during my care of the patient were reviewed by me and considered in my medical decision making (see chart for details).    MDM Rules/Calculators/A&P                          This is a 60 year old male PMHx nephrolithiasis, presenting for right flank pain and suprapubic pain.  No other infectious symptoms noted.  Right CVA tenderness on exam as well as suprapubic tenderness to palpation, otherwise benign appearing.  VSS, AF.  Initial interventions: Tylenol 1 g provided for pain control.  DDx included: Cystitis, pyelonephritis, nephrolithiasis, infected stone, appendicitis, colitis, diverticulitis, muscle strain, constipation  All studies independently reviewed by myself, d/w the attending physician, factored into my MDM. -CBC: WBC 11.7 (7.35 years ago) -UA: Nitrate positive, large leukocytes, minimal blood -Unremarkable: BMP -Eloped prior to urine culture and CT imaging  Presentation appears most consistent with right pyelonephritis.  Left prior to being able to obtain stone study to rule out infected stone.  Afebrile and relatively benign serial abdominal exams, suggest against appendicitis, colitis, diverticulitis.  Less likely muscle strain or constipation in context of infected appearing urine and flank pain.  Plan was to obtain CT stone study in order to arrange  appropriate outpatient follow-up, and discharge on Keflex.  However patient eloped prior to imaging.  Nursing staff briefly left the room, and found the room empty when they returned.  Neither nursing staff, CT techs, nor myself were able to find the patient in the department.  I called the patient/SO x3, but no one picked up, and voicemail services were unavailable.  Keflex prescription was sent to pharmacy listed on file.  Final Clinical Impression(s) / ED Diagnoses Final diagnoses:  Flank pain  Urinary tract infection without hematuria, site unspecified    Rx / DC Orders ED Discharge Orders  Ordered    cephALEXin (KEFLEX) 500 MG capsule  3 times daily        03/09/21 Lyndal Pulley, MD 03/09/21 1842    Cheryll Cockayne, MD 03/16/21 1128

## 2021-03-11 ENCOUNTER — Encounter (HOSPITAL_BASED_OUTPATIENT_CLINIC_OR_DEPARTMENT_OTHER): Payer: Self-pay | Admitting: *Deleted

## 2021-03-11 ENCOUNTER — Other Ambulatory Visit: Payer: Self-pay

## 2021-03-11 ENCOUNTER — Emergency Department (HOSPITAL_BASED_OUTPATIENT_CLINIC_OR_DEPARTMENT_OTHER): Payer: PRIVATE HEALTH INSURANCE

## 2021-03-11 ENCOUNTER — Emergency Department (HOSPITAL_BASED_OUTPATIENT_CLINIC_OR_DEPARTMENT_OTHER)
Admission: EM | Admit: 2021-03-11 | Discharge: 2021-03-11 | Disposition: A | Discharge status: Home / Self Care | Payer: PRIVATE HEALTH INSURANCE | Attending: Emergency Medicine | Admitting: Emergency Medicine

## 2021-03-11 DIAGNOSIS — F17210 Nicotine dependence, cigarettes, uncomplicated: Secondary | ICD-10-CM | POA: Diagnosis not present

## 2021-03-11 DIAGNOSIS — K8689 Other specified diseases of pancreas: Secondary | ICD-10-CM | POA: Insufficient documentation

## 2021-03-11 DIAGNOSIS — N200 Calculus of kidney: Secondary | ICD-10-CM | POA: Insufficient documentation

## 2021-03-11 DIAGNOSIS — R109 Unspecified abdominal pain: Secondary | ICD-10-CM

## 2021-03-11 DIAGNOSIS — R1030 Lower abdominal pain, unspecified: Secondary | ICD-10-CM | POA: Diagnosis present

## 2021-03-11 HISTORY — DX: Calculus of kidney: N20.0

## 2021-03-11 LAB — COMPREHENSIVE METABOLIC PANEL
ALT: 20 U/L (ref 0–44)
AST: 27 U/L (ref 15–41)
Albumin: 4.3 g/dL (ref 3.5–5.0)
Alkaline Phosphatase: 90 U/L (ref 38–126)
Anion gap: 9 (ref 5–15)
BUN: 13 mg/dL (ref 6–20)
CO2: 26 mmol/L (ref 22–32)
Calcium: 9.6 mg/dL (ref 8.9–10.3)
Chloride: 99 mmol/L (ref 98–111)
Creatinine, Ser: 0.95 mg/dL (ref 0.61–1.24)
GFR, Estimated: >60 mL/min (ref >60)
Glucose, Bld: 136 mg/dL — ABNORMAL HIGH (ref 70–99)
Potassium: 4.1 mmol/L (ref 3.5–5.1)
Sodium: 134 mmol/L — ABNORMAL LOW (ref 135–145)
Total Bilirubin: 0.7 mg/dL (ref 0.3–1.2)
Total Protein: 7.2 g/dL (ref 6.5–8.1)

## 2021-03-11 LAB — URINALYSIS, ROUTINE W REFLEX MICROSCOPIC
Bilirubin Urine: NEGATIVE
Glucose, UA: NEGATIVE mg/dL
Hgb urine dipstick: NEGATIVE
Ketones, ur: NEGATIVE mg/dL
Leukocytes,Ua: NEGATIVE
Nitrite: NEGATIVE
Protein, ur: NEGATIVE mg/dL
Specific Gravity, Urine: 1.008 (ref 1.005–1.030)
pH: 6 (ref 5.0–8.0)

## 2021-03-11 LAB — CBC WITH DIFFERENTIAL/PLATELET
Abs Immature Granulocytes: 0.07 10*3/uL (ref 0.00–0.07)
Basophils Absolute: 0 10*3/uL (ref 0.0–0.1)
Basophils Relative: 0 %
Eosinophils Absolute: 0 10*3/uL (ref 0.0–0.5)
Eosinophils Relative: 0 %
HCT: 41.5 % (ref 39.0–52.0)
Hemoglobin: 14.4 g/dL (ref 13.0–17.0)
Immature Granulocytes: 1 %
Lymphocytes Relative: 5 %
Lymphs Abs: 0.4 10*3/uL — ABNORMAL LOW (ref 0.7–4.0)
MCH: 31.8 pg (ref 26.0–34.0)
MCHC: 34.7 g/dL (ref 30.0–36.0)
MCV: 91.6 fL (ref 80.0–100.0)
Monocytes Absolute: 0.3 10*3/uL (ref 0.1–1.0)
Monocytes Relative: 4 %
Neutro Abs: 6.7 10*3/uL (ref 1.7–7.7)
Neutrophils Relative %: 90 %
Platelets: 186 10*3/uL (ref 150–400)
RBC: 4.53 MIL/uL (ref 4.22–5.81)
RDW: 12.2 % (ref 11.5–15.5)
WBC: 7.6 10*3/uL (ref 4.0–10.5)
nRBC: 0 % (ref 0.0–0.2)

## 2021-03-11 LAB — LIPASE, BLOOD: Lipase: 12 U/L (ref 11–51)

## 2021-03-11 LAB — LACTIC ACID, PLASMA: Lactic Acid, Venous: 1.1 mmol/L (ref 0.5–1.9)

## 2021-03-11 MED ORDER — MORPHINE SULFATE (PF) 4 MG/ML IV SOLN: 4.0000 mg | Freq: Once | INTRAVENOUS | Status: DC

## 2021-03-11 MED ORDER — ONDANSETRON HCL 4 MG/2ML IJ SOLN
4.0000 mg | Freq: Once | INTRAMUSCULAR | Status: DC
  Filled 2021-03-11: qty 2

## 2021-03-11 MED ORDER — CEPHALEXIN 500 MG PO CAPS: 500.0000 mg | ORAL_CAPSULE | Freq: Three times a day (TID) | ORAL | 0 refills | Status: AC

## 2021-03-11 MED ORDER — KETOROLAC TROMETHAMINE 30 MG/ML IJ SOLN
30.0000 mg | Freq: Once | INTRAMUSCULAR | Status: AC
  Administered 2021-03-11: 30 mg via INTRAMUSCULAR

## 2021-03-11 MED ORDER — ONDANSETRON 4 MG PO TBDP
8.0000 mg | ORAL_TABLET | Freq: Once | ORAL | Status: AC
  Administered 2021-03-11: 8 mg via ORAL
  Filled 2021-03-11: qty 2

## 2021-03-11 MED ORDER — KETOROLAC TROMETHAMINE 30 MG/ML IJ SOLN
30.0000 mg | Freq: Once | INTRAMUSCULAR | Status: DC
  Filled 2021-03-11: qty 1

## 2021-03-11 MED ORDER — SODIUM CHLORIDE 0.9 % IV BOLUS: 1000.0000 mL | Freq: Once | INTRAVENOUS | Status: DC

## 2021-03-11 NOTE — ED Notes (Signed)
Patient transported to CT 

## 2021-03-11 NOTE — ED Triage Notes (Signed)
Rt posterior flank pain, seen at [facility] on Friday for same. Told to return if pain continued. Returns today since pain has not improved. Pain with urination.

## 2021-03-11 NOTE — ED Notes (Signed)
Urine Culture sent with UA 

## 2021-03-11 NOTE — ED Provider Notes (Signed)
[facility] EMERGENCY DEPT Provider Note   CSN: [id number] Arrival date & time: 03/11/21  2725     History Chief Complaint  Patient presents with   Flank Pain    Larry Sparks is a 60 y.o. male.  He is complaining of some right flank pain and lower abdominal pain that started 3 days ago.  He rates the pain as severe.  Its been associated with nausea vomiting and diaphoresis.  He said he went to [facility] 3 days ago and had some labs were done.  He said he was discharged although the note reflects that he eloped.  He continues to have symptoms.  He was constipated but this improved after taking some milk of magnesia.  The history is provided by the patient.  Flank Pain This is a new problem. The current episode started more than 2 days ago. The problem occurs constantly. The problem has not changed since onset.Associated symptoms include abdominal pain. Pertinent negatives include no chest pain, no headaches and no shortness of breath. The symptoms are aggravated by bending and twisting. Nothing relieves the symptoms. He has tried rest for the symptoms. The treatment provided no relief.      Past Medical History:  Diagnosis Date   Kidney stones     There are no problems to display for this patient.   Past Surgical History:  Procedure Laterality Date   LEG SURGERY     right       No family history on file.  Social History   Tobacco Use   Smoking status: Some Days    Packs/day: 0.50    Years: 42.00    Pack years: 21.00    Types: Cigarettes   Smokeless tobacco: Never  Substance Use Topics   Alcohol use: Not Currently   Drug use: Yes    Types: Marijuana    Home Medications Prior to Admission medications   Medication Sig Start Date End Date Taking? Authorizing Provider  cephALEXin (KEFLEX) 500 MG capsule Take 1 capsule (500 mg total) by mouth 3 (three) times daily for 7 days. 03/09/21 03/16/21 Yes Chandrasekar, Additya, MD  ibuprofen (ADVIL) 200  MG tablet Take 1,000 mg by mouth 2 (two) times daily.   Yes [provider]    Allergies    Patient has no known allergies.  Review of Systems   Review of Systems  Constitutional:  Positive for diaphoresis. Negative for fever.  HENT:  Negative for sore throat.   Eyes:  Negative for visual disturbance.  Respiratory:  Negative for shortness of breath.   Cardiovascular:  Negative for chest pain.  Gastrointestinal:  Positive for abdominal pain, nausea and vomiting.  Genitourinary:  Positive for flank pain. Negative for dysuria.  Musculoskeletal:  Positive for back pain.  Skin:  Negative for rash.  Neurological:  Negative for headaches.   Physical Exam Updated Vital Signs BP (!) 157/90 (BP Location: Right Arm)   Pulse 72   Temp 98.1 F (36.7 C) (Oral)   Resp 17   Ht 5\' 7"  (1.702 m)   Wt 77.1 kg   SpO2 98%   BMI 26.63 kg/m   Physical Exam Vitals and nursing note reviewed.  Constitutional:      Appearance: Normal appearance. He is well-developed.  HENT:     Head: Normocephalic and atraumatic.  Eyes:     Conjunctiva/sclera: Conjunctivae normal.  Cardiovascular:     Rate and Rhythm: Normal rate and regular rhythm.     Heart sounds:  No murmur heard. Pulmonary:     Effort: Pulmonary effort is normal. No respiratory distress.     Breath sounds: Normal breath sounds.  Abdominal:     Palpations: Abdomen is soft.     Tenderness: There is abdominal tenderness (Diffuse). There is no guarding or rebound.  Musculoskeletal:        General: Tenderness (Lower back on right) present. Normal range of motion.     Cervical back: Neck supple.  Skin:    General: Skin is warm and dry.  Neurological:     General: No focal deficit present.     Mental Status: He is alert.     Gait: Gait normal.    ED Results / Procedures / Treatments   Labs (all labs ordered are listed, but only abnormal results are displayed) Labs Reviewed  COMPREHENSIVE METABOLIC PANEL - Abnormal; Notable  for the following components:      Result Value   Sodium 134 (*)    Glucose, Bld 136 (*)    All other components within normal limits  CBC WITH DIFFERENTIAL/PLATELET - Abnormal; Notable for the following components:   Lymphs Abs 0.4 (*)    All other components within normal limits  URINE CULTURE  URINALYSIS, ROUTINE W REFLEX MICROSCOPIC  LIPASE, BLOOD  LACTIC ACID, PLASMA    EKG None  Radiology CT Renal Stone Study  Result Date: 03/11/2021 CLINICAL DATA:  Right flank pain, persistent EXAM: CT ABDOMEN AND PELVIS WITHOUT CONTRAST TECHNIQUE: Multidetector CT imaging of the abdomen and pelvis was performed following the standard protocol without IV contrast. COMPARISON:  02/08/2012 FINDINGS: Lower chest: No pleural or pericardial effusion. Visualized lung bases clear. Hepatobiliary: No focal liver abnormality is seen. No gallstones, gallbladder wall thickening, or biliary dilatation. Pancreas: Scattered calcifications in the pancreatic neck and head region, new since 2013. No pancreatic ductal dilatation or surrounding inflammatory changes. Spleen: Normal in size without focal abnormality. Adrenals/Urinary Tract: Adrenal glands unremarkable. Bilateral nephrolithiasis. Largest left 2 mm, mid collecting system. Scattered right 1 mm calculi. No hydronephrosis. No definite urolithiasis or ureterectasis. The urinary bladder is nondistended. Stomach/Bowel: Stomach is nondistended. Small bowel is decompressed. Appendix not discretely identified. The colon is nondilated, unremarkable. Vascular/Lymphatic: Scattered aortic and moderate iliofemoral calcified plaque without aneurysm. No abdominal or pelvic adenopathy. Reproductive: Prostate is unremarkable. Other: No ascites.  No free air. Musculoskeletal: Mild multilevel spondylitic change in the lumbar spine. No fracture or worrisome bone lesion. IMPRESSION: 1. Bilateral nephrolithiasis without hydronephrosis. 2. Pancreatic parenchymal calcifications  suggesting chronic pancreatitis. 3.  Aortic Atherosclerosis (ICD10-170.0). Electronically Signed   By: Corlis Leak M.D.   On: 03/11/2021 10:35    Procedures Procedures   Medications Ordered in ED Medications  ondansetron (ZOFRAN-ODT) disintegrating tablet 8 mg (8 mg Oral Given 03/11/21 0949)  ketorolac (TORADOL) 30 MG/ML injection 30 mg (30 mg Intramuscular Given 03/11/21 0947)    ED Course  I have reviewed the triage vital signs and the nursing notes.  Pertinent labs & imaging results that were available during my care of the patient were reviewed by me and considered in my medical decision making (see chart for details).  Clinical Course as of 03/11/21 1700  Sun Mar 11, 2021  9379 Patient refusing to have an IV placed. [MB]  1102 Reviewed results with patient and wife.  He said he has been sharing her prescription of Keflex because wherever they sent the prescription to has not been open.  I am going to write him a prescription for Keflex  even though the urine looks cleared because possibly it is doing something.  Otherwise no indications for admission. [MB]    Clinical Course User Index [MB] Terrilee Files, MD   MDM Rules/Calculators/A&P                         This patient complains of abdominal and back pain, urinary frequency; this involves an extensive number of treatment Options and is a complaint that carries with it a high risk of complications and Morbidity. The differential includes UTI, prostatitis, renal colic, pyelonephritis, constipation, obstruction  I ordered, reviewed and interpreted labs, which included CBC with normal white count normal hemoglobin, chemistries fairly normal, LFTs normal, lactate not elevated, urinalysis unremarkable I ordered medication IM Toradol and oral Zofran.  Patient declined IV fluids IV pain medication. I ordered imaging studies which included CT renal and I independently    visualized and interpreted imaging which showed no acute  findings, has bilateral nephrolithiasis and some pancreatic calcifications question chronic pancreatitis Additional history obtained from patient's wife Previous records obtained and reviewed in epic including prior ED visit 2 days ago.  After the interventions stated above, I reevaluated the patient and found patient to be hemodynamically stable and nontoxic-appearing.  He is asking for a prescription for antibiotics as he has been sharing his wife's prescription.  I provided this.  Recommended close follow-up with PCP.  Return instructions discussed   Final Clinical Impression(s) / ED Diagnoses Final diagnoses:  Flank pain, acute    Rx / DC Orders ED Discharge Orders          Ordered    cephALEXin (KEFLEX) 500 MG capsule  3 times daily        03/11/21 1103             Terrilee Files, MD 03/11/21 [phone]

## 2021-03-11 NOTE — Discharge Instructions (Addendum)
You were seen in the emergency department for flank pain.  You had lab work urinalysis and a CAT scan that did not show a clear cause of your symptoms.  We are prescribing some antibiotics to take.  He can also use Tylenol and ibuprofen for pain.  Drink plenty of fluids.  Return to the emergency department if any worsening or concerning symptoms.

## 2021-03-12 LAB — URINE CULTURE: Culture: NO GROWTH

## 2021-03-14 ENCOUNTER — Ambulatory Visit: Payer: Self-pay

## 2021-03-14 NOTE — Telephone Encounter (Signed)
Pt reports chest pain. Evasive historian. Seen in ED Friday, again Sunday at [facility] "Kidney stones but aren't moving.Didn't know if they saw what was causing my chest pain."" States pain not worsening, "Soreness." Took a lot of advil over months. Mid chest "Sore." Denies any associated symptoms. No worsening flank pain, no hematuria. Has appt 03/21/21 with urologist. Advised ED for worsening pain. Care advise given, verbalizes understanding.   Reason for Disposition  [1] Chest pain(s) lasting a few seconds AND [2] persists > 3 days  Answer Assessment - Initial Assessment Questions 1. LOCATION: "Where does it hurt?"       Middle 2. RADIATION: "Does the pain go anywhere else?" (e.g., into neck, jaw, arms, back)     no 3. ONSET: "When did the chest pain begin?" (Minutes, hours or days)      Friday seen in ED 4. PATTERN "Does the pain come and go, or has it been constant since it started?"  "Does it get worse with exertion?"      "Soreness" 5. DURATION: "How long does it last" (e.g., seconds, minutes, hours)     Constant 6. SEVERITY: "How bad is the pain?"  (e.g., Scale 1-10; mild, moderate, or severe)    - MILD (1-3): doesn't interfere with normal activities     - MODERATE (4-7): interferes with normal activities or awakens from sleep    - SEVERE (8-10): excruciating pain, unable to do any normal activities       5/10 7. CARDIAC RISK FACTORS: "Do you have any history of heart problems or risk factors for heart disease?" (e.g., angina, prior heart attack; diabetes, high blood pressure, high cholesterol, smoker, or strong family history of heart disease)      8. PULMONARY RISK FACTORS: "Do you have any history of lung disease?"  (e.g., blood clots in lung, asthma, emphysema, birth control pills)      9. CAUSE: "What do you think is causing the chest pain?"     Advil, taking a lot 10. OTHER SYMPTOMS: "Do you have any other symptoms?" (e.g., dizziness, nausea, vomiting, sweating, fever,  difficulty breathing, cough)       No, Has kidney stones  Protocols used: Chest Pain-A-AH

## 2021-03-14 NOTE — Telephone Encounter (Signed)
Patient and wife called, left VM to return the call to speak to a [place] to [phone].   Summary: Chest pains   The caller was not with the patient   ----- Message from Randol Kern sent at 03/14/2021  4:32 PM EDT -----  Pt's wife called reporting that the patient has severe chest pains  Best contact: [phone]

## 2021-06-15 ENCOUNTER — Emergency Department (HOSPITAL_BASED_OUTPATIENT_CLINIC_OR_DEPARTMENT_OTHER): Payer: PRIVATE HEALTH INSURANCE

## 2021-06-15 ENCOUNTER — Encounter (HOSPITAL_BASED_OUTPATIENT_CLINIC_OR_DEPARTMENT_OTHER): Payer: Self-pay | Admitting: *Deleted

## 2021-06-15 ENCOUNTER — Other Ambulatory Visit: Payer: Self-pay

## 2021-06-15 ENCOUNTER — Emergency Department (HOSPITAL_BASED_OUTPATIENT_CLINIC_OR_DEPARTMENT_OTHER)
Admission: EM | Admit: 2021-06-15 | Discharge: 2021-06-15 | Disposition: A | Discharge status: Home / Self Care | Payer: PRIVATE HEALTH INSURANCE | Attending: Emergency Medicine | Admitting: Emergency Medicine

## 2021-06-15 DIAGNOSIS — N451 Epididymitis: Secondary | ICD-10-CM | POA: Insufficient documentation

## 2021-06-15 DIAGNOSIS — N433 Hydrocele, unspecified: Secondary | ICD-10-CM | POA: Insufficient documentation

## 2021-06-15 DIAGNOSIS — M545 Low back pain, unspecified: Secondary | ICD-10-CM | POA: Diagnosis not present

## 2021-06-15 DIAGNOSIS — F17210 Nicotine dependence, cigarettes, uncomplicated: Secondary | ICD-10-CM | POA: Diagnosis not present

## 2021-06-15 LAB — BASIC METABOLIC PANEL
Anion gap: 10 (ref 5–15)
BUN: 10 mg/dL (ref 6–20)
CO2: 25 mmol/L (ref 22–32)
Calcium: 9.4 mg/dL (ref 8.9–10.3)
Chloride: 101 mmol/L (ref 98–111)
Creatinine, Ser: 0.94 mg/dL (ref 0.61–1.24)
GFR, Estimated: >60 mL/min (ref >60)
Glucose, Bld: 91 mg/dL (ref 70–99)
Potassium: 3.9 mmol/L (ref 3.5–5.1)
Sodium: 136 mmol/L (ref 135–145)

## 2021-06-15 LAB — URINALYSIS, ROUTINE W REFLEX MICROSCOPIC
Bilirubin Urine: NEGATIVE
Glucose, UA: NEGATIVE mg/dL
Hgb urine dipstick: NEGATIVE
Ketones, ur: NEGATIVE mg/dL
Nitrite: NEGATIVE
Protein, ur: NEGATIVE mg/dL
Specific Gravity, Urine: 1.015 (ref 1.005–1.030)
pH: 6 (ref 5.0–8.0)

## 2021-06-15 LAB — URINALYSIS, MICROSCOPIC (REFLEX): RBC / HPF: NONE SEEN RBC/hpf (ref 0–5)

## 2021-06-15 LAB — CBC WITH DIFFERENTIAL/PLATELET
Abs Immature Granulocytes: 0.07 10*3/uL (ref 0.00–0.07)
Basophils Absolute: 0.1 10*3/uL (ref 0.0–0.1)
Basophils Relative: 1 %
Eosinophils Absolute: 0.1 10*3/uL (ref 0.0–0.5)
Eosinophils Relative: 1 %
HCT: 40.7 % (ref 39.0–52.0)
Hemoglobin: 14.1 g/dL (ref 13.0–17.0)
Immature Granulocytes: 1 %
Lymphocytes Relative: 11 %
Lymphs Abs: 1.6 10*3/uL (ref 0.7–4.0)
MCH: 32.6 pg (ref 26.0–34.0)
MCHC: 34.6 g/dL (ref 30.0–36.0)
MCV: 94 fL (ref 80.0–100.0)
Monocytes Absolute: 1.1 10*3/uL — ABNORMAL HIGH (ref 0.1–1.0)
Monocytes Relative: 7 %
Neutro Abs: 12.3 10*3/uL — ABNORMAL HIGH (ref 1.7–7.7)
Neutrophils Relative %: 79 %
Platelets: 354 10*3/uL (ref 150–400)
RBC: 4.33 MIL/uL (ref 4.22–5.81)
RDW: 13.3 % (ref 11.5–15.5)
WBC: 15.3 10*3/uL — ABNORMAL HIGH (ref 4.0–10.5)
nRBC: 0 % (ref 0.0–0.2)

## 2021-06-15 MED ORDER — HYDROCODONE-ACETAMINOPHEN 5-325 MG PO TABS: 1.0000 | ORAL_TABLET | Freq: Four times a day (QID) | ORAL | 0 refills | Status: DC | PRN

## 2021-06-15 MED ORDER — HYDROCODONE-ACETAMINOPHEN 5-325 MG PO TABS
1.0000 | ORAL_TABLET | Freq: Once | ORAL | Status: AC
  Administered 2021-06-15: 1 via ORAL
  Filled 2021-06-15: qty 1

## 2021-06-15 MED ORDER — LEVOFLOXACIN 500 MG PO TABS
500.0000 mg | ORAL_TABLET | Freq: Once | ORAL | Status: AC
  Administered 2021-06-15: 500 mg via ORAL
  Filled 2021-06-15: qty 1

## 2021-06-15 MED ORDER — MORPHINE SULFATE (PF) 4 MG/ML IV SOLN
4.0000 mg | Freq: Once | INTRAVENOUS | Status: AC
  Administered 2021-06-15: 4 mg via INTRAVENOUS
  Filled 2021-06-15: qty 1

## 2021-06-15 MED ORDER — LEVOFLOXACIN 500 MG PO TABS: 500.0000 mg | ORAL_TABLET | Freq: Every day | ORAL | 0 refills | Status: DC

## 2021-06-15 NOTE — ED Provider Notes (Signed)
[facility] EMERGENCY DEPARTMENT Provider Note   CSN: [id number] Arrival date & time: 06/15/21  1808     History Chief Complaint  Patient presents with   Groin Swelling    Larry Sparks is a 60 y.o. male.  He is here with a complaint of right-sided low back pain and right testicular swelling x5 days.  No trauma.  No abdominal pain.  No fevers chills nausea vomiting or urinary symptoms.  Tried Tylenol without any improvement.  Also tried some leftover antibiotics without improvement.  The history is provided by the patient.  Male GU Problem Presenting symptoms: scrotal pain and swelling   Scrotal pain:    Affected testicle:  Right   Severity:  Moderate   Onset quality:  Gradual   Duration:  5 days   Timing:  Constant   Progression:  Worsening   Chronicity:  New Context: spontaneously   Relieved by:  Nothing Worsened by:  Movement and tactile pressure Ineffective treatments: Acetaminophen. Associated symptoms: scrotal swelling   Associated symptoms: no abdominal pain, no diarrhea, no fever, no flank pain, no genital lesions, no groin pain, no hematuria, no nausea, no urinary frequency, no urinary hesitation, no urinary incontinence, no urinary retention and no vomiting       Past Medical History:  Diagnosis Date   Kidney stones     There are no problems to display for this patient.   Past Surgical History:  Procedure Laterality Date   KNEE ARTHROSCOPY     LEG SURGERY     right       No family history on file.  Social History   Tobacco Use   Smoking status: Some Days    Packs/day: 1.00    Years: 42.00    Pack years: 42.00    Types: Cigarettes   Smokeless tobacco: Never  Substance Use Topics   Alcohol use: Not Currently   Drug use: Yes    Types: Marijuana    Home Medications Prior to Admission medications   Medication Sig Start Date End Date Taking? Authorizing Provider  ibuprofen (ADVIL) 200 MG tablet Take 1,000 mg by mouth 2 (two)  times daily.    [provider]    Allergies    Patient has no known allergies.  Review of Systems   Review of Systems  Constitutional:  Negative for fever.  HENT:  Negative for sore throat.   Eyes:  Negative for visual disturbance.  Respiratory:  Negative for shortness of breath.   Cardiovascular:  Negative for chest pain.  Gastrointestinal:  Negative for abdominal pain, diarrhea, nausea and vomiting.  Genitourinary:  Positive for scrotal swelling. Negative for bladder incontinence, flank pain, frequency, hematuria and hesitancy.  Musculoskeletal:  Positive for back pain.  Skin:  Negative for rash.  Neurological:  Negative for headaches.   Physical Exam Updated Vital Signs BP 127/77   Pulse (!) 104   Temp 98.4 F (36.9 C) (Oral)   Resp 16   Ht 5\' 8"  (1.727 m)   Wt 68 kg   SpO2 98%   BMI 22.81 kg/m   Physical Exam Vitals and nursing note reviewed.  Constitutional:      Appearance: Normal appearance. He is well-developed.  HENT:     Head: Normocephalic and atraumatic.  Eyes:     Conjunctiva/sclera: Conjunctivae normal.  Cardiovascular:     Rate and Rhythm: Normal rate and regular rhythm.     Heart sounds: No murmur heard. Pulmonary:  Effort: Pulmonary effort is normal. No respiratory distress.     Breath sounds: Normal breath sounds.  Abdominal:     Palpations: Abdomen is soft.     Tenderness: There is no abdominal tenderness. There is no guarding or rebound.  Genitourinary:    Penis: Normal.      Comments: Right hemiscrotum is slightly reddened and tender with appreciable mass.  Left testicle nontender.  No inguinal hernia appreciated.  Groin nontender without any lesions.  Penis normal without lesions. Musculoskeletal:     Cervical back: Neck supple.  Skin:    General: Skin is warm and dry.  Neurological:     General: No focal deficit present.     Mental Status: He is alert.     Gait: Gait normal.    ED Results / Procedures / Treatments    Labs (all labs ordered are listed, but only abnormal results are displayed) Labs Reviewed  CBC WITH DIFFERENTIAL/PLATELET - Abnormal; Notable for the following components:      Result Value   WBC 15.3 (*)    Neutro Abs 12.3 (*)    Monocytes Absolute 1.1 (*)    All other components within normal limits  URINALYSIS, ROUTINE W REFLEX MICROSCOPIC - Abnormal; Notable for the following components:   Leukocytes,Ua TRACE (*)    All other components within normal limits  URINALYSIS, MICROSCOPIC (REFLEX) - Abnormal; Notable for the following components:   Bacteria, UA RARE (*)    All other components within normal limits  BASIC METABOLIC PANEL    EKG None  Radiology US SCROTUM W/DOPPLER  Result Date: 06/15/2021 CLINICAL DATA:  Right scrotal mass, pain for 4 days EXAM: SCROTAL ULTRASOUND DOPPLER ULTRASOUND OF THE TESTICLES TECHNIQUE: Complete ultrasound examination of the testicles, epididymis, and other scrotal structures was performed. Color and spectral Doppler ultrasound were also utilized to evaluate blood flow to the testicles. COMPARISON:  03/11/2021 FINDINGS: Right testicle Measurements: 4.7 x 3.3 x 3.1 cm. No mass or microlithiasis visualized. Left testicle Measurements: 4.3 x 2.9 by 3.1 cm. No mass or microlithiasis visualized. Right epididymis: The right epididymis is enlarged measuring 3.7 x 1.6 cm, with a simple 1.3 x 1.6 x 1.4 cm right epididymal cyst. A circumscribed hypoechoic solid 1.0 x 1.1 x 1.0 cm nodule within the right epididymis most likely reflects a benign etiology such as small adenomatoid tumor or sperm granuloma. Left epididymis: Grossly normal in size. There is a 0.5 x 0.3 x 0.4 cm simple cyst identified. Hydrocele: There is a large right hydrocele with numerous thin internal septations. A trace simple appearing left hydrocele is also noted. Varicocele:  None visualized. Pulsed Doppler interrogation of both testes demonstrates normal low resistance arterial and venous  waveforms bilaterally. IMPRESSION: 1. Enlarged right epididymis as above. Benign-appearing 1.1 cm solid nodule likely reflects a benign etiology such as adenomatoid tumor or sperm granuloma. Simple appearing 1.6 cm right epididymal cyst also noted. Either could reflect the patient's noted palpable abnormality. 2. Large complex right hydrocele with numerous internal septations. 3. Trace simple left hydrocele. 4. Unremarkable appearance of the testes. Electronically Signed   By: Sharlet Salina M.D.   On: 06/15/2021 19:41    Procedures Procedures   Medications Ordered in ED Medications  morphine 4 MG/ML injection 4 mg (has no administration in time range)    ED Course  I have reviewed the triage vital signs and the nursing notes.  Pertinent labs & imaging results that were available during my care of the patient were  reviewed by me and considered in my medical decision making (see chart for details).  Clinical Course as of 06/16/21 1055  Fri Jun 15, 2021  2009 Discussed with Dr. Laverle Patter from urology.  He reviewed the patient's labs and imaging.  Recommends treating with Levaquin x10 to 14 days and follow-up in the office.  If has fevers or worsening condition he should present to [facility], ED for evaluation. [MB]    Clinical Course User Index [MB] Terrilee Files, MD   MDM Rules/Calculators/A&P                          This patient complains of right scrotal swelling and pain; this involves an extensive number of treatment Options and is a complaint that carries with it a high risk of complications and Morbidity. The differential includes torsion, epididymitis, orchitis, hydrocele, scrotal abscess, necrotizing fasciitis, incarcerated hernia  I ordered, reviewed and interpreted labs, which included CBC with elevated white count normal hemoglobin, chemistries normal, urinalysis without clear signs of infection I ordered medication IV pain medication with improvement in his symptoms I  ordered imaging studies which included scrotal ultrasound and I independently    visualized and interpreted imaging which showed acute epididymitis and acute complicated hydrocele Additional history obtained from patient's significant other Previous records obtained and reviewed in epic, few prior visits for renal colic type symptoms I consulted Dr. Laverle Patter urology and discussed lab and imaging findings  Critical Interventions: None  After the interventions stated above, I reevaluated the patient and found patient's pain to be improved.  He is agreeable to plan for antibiotics and outpatient follow-up with urology.  Return instructions discussed.   Final Clinical Impression(s) / ED Diagnoses Final diagnoses:  Epididymitis without abscess  Hydrocele, unspecified hydrocele type    Rx / DC Orders ED Discharge Orders          Ordered    HYDROcodone-acetaminophen (NORCO/VICODIN) 5-325 MG tablet  Every 6 hours PRN        06/15/21 2016    levofloxacin (LEVAQUIN) 500 MG tablet  Daily        06/15/21 2016             Terrilee Files, MD 06/16/21 1057

## 2021-06-15 NOTE — ED Triage Notes (Signed)
C/o  bil lower back pain and testicle swelling x 5 days

## 2021-06-15 NOTE — Discharge Instructions (Addendum)
You were seen in the emergency department for back pain and right scrotal and testicular pain.  Your ultrasound showed epididymitis and hydrocele.  Urology Dr. Laverle Patter is recommending that we treat you with antibiotics.  He wants you to call the office on Monday for follow-up with someone in their group.  If you experiencing high fever or worsening symptoms please be seen at the [facility] emergency department.

## 2021-09-20 ENCOUNTER — Other Ambulatory Visit: Payer: Self-pay

## 2021-09-20 ENCOUNTER — Emergency Department (HOSPITAL_COMMUNITY)
Admission: EM | Admit: 2021-09-20 | Discharge: 2021-09-20 | Disposition: A | Discharge status: Home / Self Care | Payer: PRIVATE HEALTH INSURANCE | Attending: Emergency Medicine | Admitting: Emergency Medicine

## 2021-09-20 ENCOUNTER — Emergency Department (HOSPITAL_COMMUNITY): Payer: PRIVATE HEALTH INSURANCE

## 2021-09-20 DIAGNOSIS — Z7982 Long term (current) use of aspirin: Secondary | ICD-10-CM | POA: Insufficient documentation

## 2021-09-20 DIAGNOSIS — R0789 Other chest pain: Secondary | ICD-10-CM | POA: Insufficient documentation

## 2021-09-20 DIAGNOSIS — R42 Dizziness and giddiness: Secondary | ICD-10-CM | POA: Insufficient documentation

## 2021-09-20 DIAGNOSIS — F17210 Nicotine dependence, cigarettes, uncomplicated: Secondary | ICD-10-CM | POA: Diagnosis not present

## 2021-09-20 DIAGNOSIS — R053 Chronic cough: Secondary | ICD-10-CM | POA: Diagnosis not present

## 2021-09-20 DIAGNOSIS — R110 Nausea: Secondary | ICD-10-CM | POA: Insufficient documentation

## 2021-09-20 LAB — TROPONIN I (HIGH SENSITIVITY)
Troponin I (High Sensitivity): 2 ng/L (ref <18)
Troponin I (High Sensitivity): 3 ng/L (ref <18)

## 2021-09-20 LAB — BASIC METABOLIC PANEL
Anion gap: 10 (ref 5–15)
BUN: 12 mg/dL (ref 6–20)
CO2: 26 mmol/L (ref 22–32)
Calcium: 9.7 mg/dL (ref 8.9–10.3)
Chloride: 102 mmol/L (ref 98–111)
Creatinine, Ser: 1.02 mg/dL (ref 0.61–1.24)
GFR, Estimated: >60 mL/min (ref >60)
Glucose, Bld: 101 mg/dL — ABNORMAL HIGH (ref 70–99)
Potassium: 3.9 mmol/L (ref 3.5–5.1)
Sodium: 138 mmol/L (ref 135–145)

## 2021-09-20 LAB — CBC
HCT: 44.4 % (ref 39.0–52.0)
Hemoglobin: 14.9 g/dL (ref 13.0–17.0)
MCH: 31.9 pg (ref 26.0–34.0)
MCHC: 33.6 g/dL (ref 30.0–36.0)
MCV: 95.1 fL (ref 80.0–100.0)
Platelets: 275 10*3/uL (ref 150–400)
RBC: 4.67 MIL/uL (ref 4.22–5.81)
RDW: 12.7 % (ref 11.5–15.5)
WBC: 8.6 10*3/uL (ref 4.0–10.5)
nRBC: 0 % (ref 0.0–0.2)

## 2021-09-20 MED ORDER — ASPIRIN 325 MG PO TABS
325.0000 mg | ORAL_TABLET | Freq: Every day | ORAL | Status: DC
  Administered 2021-09-20: 325 mg via ORAL
  Filled 2021-09-20: qty 1

## 2021-09-20 MED ORDER — NICOTINE 7 MG/24HR TD PT24
7.0000 mg | MEDICATED_PATCH | Freq: Once | TRANSDERMAL | Status: DC
  Administered 2021-09-20: 7 mg via TRANSDERMAL
  Filled 2021-09-20 (×2): qty 1
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
## 2021-09-20 NOTE — ED Notes (Signed)
Pt verbalizes understanding of discharge instructions. Opportunity for questions and answers were provided. Pt discharged from the ED.   ?
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
## 2021-09-20 NOTE — Discharge Instructions (Addendum)
He was seen and evaluated in our emergency department for your chest pain.  Our imaging labs and EKG did not reveal acute injury.  Please follow-up with your primary care provider for treatment of your chronic conditions.  I do recommend smoking cessation.

## 2021-09-29 ENCOUNTER — Other Ambulatory Visit: Payer: Self-pay

## 2021-09-29 ENCOUNTER — Emergency Department (HOSPITAL_BASED_OUTPATIENT_CLINIC_OR_DEPARTMENT_OTHER): Payer: PRIVATE HEALTH INSURANCE

## 2021-09-29 ENCOUNTER — Encounter (HOSPITAL_BASED_OUTPATIENT_CLINIC_OR_DEPARTMENT_OTHER): Payer: Self-pay | Admitting: Emergency Medicine

## 2021-09-29 ENCOUNTER — Emergency Department (HOSPITAL_BASED_OUTPATIENT_CLINIC_OR_DEPARTMENT_OTHER)
Admission: EM | Admit: 2021-09-29 | Discharge: 2021-09-29 | Disposition: A | Discharge status: Home / Self Care | Payer: PRIVATE HEALTH INSURANCE | Attending: Emergency Medicine | Admitting: Emergency Medicine

## 2021-09-29 DIAGNOSIS — Z20822 Contact with and (suspected) exposure to covid-19: Secondary | ICD-10-CM | POA: Diagnosis not present

## 2021-09-29 DIAGNOSIS — Z7982 Long term (current) use of aspirin: Secondary | ICD-10-CM | POA: Insufficient documentation

## 2021-09-29 DIAGNOSIS — R059 Cough, unspecified: Secondary | ICD-10-CM | POA: Insufficient documentation

## 2021-09-29 DIAGNOSIS — R0789 Other chest pain: Secondary | ICD-10-CM | POA: Insufficient documentation

## 2021-09-29 DIAGNOSIS — R0602 Shortness of breath: Secondary | ICD-10-CM

## 2021-09-29 DIAGNOSIS — Z79899 Other long term (current) drug therapy: Secondary | ICD-10-CM | POA: Diagnosis not present

## 2021-09-29 DIAGNOSIS — R051 Acute cough: Secondary | ICD-10-CM

## 2021-09-29 LAB — TROPONIN I (HIGH SENSITIVITY)
Troponin I (High Sensitivity): 3 ng/L (ref <18)
Troponin I (High Sensitivity): 3 ng/L (ref <18)

## 2021-09-29 LAB — BASIC METABOLIC PANEL
Anion gap: 10 (ref 5–15)
BUN: 12 mg/dL (ref 6–20)
CO2: 25 mmol/L (ref 22–32)
Calcium: 9.8 mg/dL (ref 8.9–10.3)
Chloride: 103 mmol/L (ref 98–111)
Creatinine, Ser: 0.87 mg/dL (ref 0.61–1.24)
GFR, Estimated: >60 mL/min (ref >60)
Glucose, Bld: 107 mg/dL — ABNORMAL HIGH (ref 70–99)
Potassium: 3.9 mmol/L (ref 3.5–5.1)
Sodium: 138 mmol/L (ref 135–145)

## 2021-09-29 LAB — CBC
HCT: 43.5 % (ref 39.0–52.0)
Hemoglobin: 14.7 g/dL (ref 13.0–17.0)
MCH: 31.9 pg (ref 26.0–34.0)
MCHC: 33.8 g/dL (ref 30.0–36.0)
MCV: 94.4 fL (ref 80.0–100.0)
Platelets: 276 10*3/uL (ref 150–400)
RBC: 4.61 MIL/uL (ref 4.22–5.81)
RDW: 12.9 % (ref 11.5–15.5)
WBC: 6.6 10*3/uL (ref 4.0–10.5)
nRBC: 0 % (ref 0.0–0.2)

## 2021-09-29 LAB — RESP PANEL BY RT-PCR (FLU A&B, COVID) ARPGX2
Influenza A by PCR: NEGATIVE
Influenza B by PCR: NEGATIVE
SARS Coronavirus 2 by RT PCR: NEGATIVE

## 2021-09-29 NOTE — ED Notes (Signed)
Pt refuses IV; states he wants butterfly and understands that if he needs iv meds or admission, he will need an IV.
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
## 2021-09-29 NOTE — Discharge Instructions (Addendum)
It was a pleasure taking care of you today!  Your work-up was negative in the ED.  You may take over-the-counter cough and cold medications such as Mucinex, DayQuil or NyQuil as needed for your symptoms.  Call your primary care provider on 10/01/2021 to schedule follow-up appointment regarding today's ED visit.  Return to the ED if you are experiencing increasing/worsening trouble breathing, chest pain, fever, worsening cough, worsening symptoms.
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
## 2021-10-18 ENCOUNTER — Ambulatory Visit (INDEPENDENT_AMBULATORY_CARE_PROVIDER_SITE_OTHER): Payer: PRIVATE HEALTH INSURANCE | Admitting: Cardiology

## 2021-10-18 ENCOUNTER — Encounter: Payer: Self-pay | Admitting: Cardiology

## 2021-10-18 ENCOUNTER — Other Ambulatory Visit: Payer: Self-pay

## 2021-10-18 ENCOUNTER — Ambulatory Visit (INDEPENDENT_AMBULATORY_CARE_PROVIDER_SITE_OTHER): Payer: PRIVATE HEALTH INSURANCE

## 2021-10-18 VITALS — BP 142/82 | Ht 68.0 in | Wt 142.6 lb

## 2021-10-18 DIAGNOSIS — R55 Syncope and collapse: Secondary | ICD-10-CM

## 2021-10-18 NOTE — Patient Instructions (Addendum)
Medication Instructions:  Your physician recommends that you continue on your current medications as directed. Please refer to the Current Medication list given to you today.  *If you need a refill on your cardiac medications before your next appointment, please call your pharmacy*  Lab Work: Your physician recommends that you return for lab work TODAY:  TSH  If you have labs (blood work) drawn today and your tests are completely normal, you will receive your results only by: MyChart Message (if you have MyChart) OR A paper copy in the mail If you have any lab test that is abnormal or we need to change your treatment, we will call you to review the results.  Testing/Procedures: Your physician has requested that you have an echocardiogram. Echocardiography is a painless test that uses sound waves to create images of your heart. It provides your doctor with information about the size and shape of your heart and how well your hearts chambers and valves are working. This procedure takes approximately one hour. There are no restrictions for this procedure.   ZIO XT- Long Term Monitor Instructions  Your physician has requested you wear a ZIO patch monitor for 14 days.  This is a single patch monitor. [employer] supplies one patch monitor per enrollment. Additional stickers are not available. Please do not apply patch if you will be having a Nuclear Stress Test,  Echocardiogram, Cardiac CT, MRI, or Chest Xray during the period you would be wearing the  monitor. The patch cannot be worn during these tests. You cannot remove and re-apply the  ZIO XT patch monitor.  Your ZIO patch monitor will be mailed 3 day USPS to your address on file. It may take 3-5 days  to receive your monitor after you have been enrolled.  Once you have received your monitor, please review the enclosed instructions. Your monitor  has already been registered assigning a specific monitor serial # to you.  Billing and Patient  Assistance Program Information  We have supplied [employer] with any of your insurance information on file for billing purposes. [employer] offers a sliding scale Patient Assistance Program for patients that do not have  insurance, or whose insurance does not completely cover the cost of the ZIO monitor.  You must apply for the Patient Assistance Program to qualify for this discounted rate.  To apply, please call [employer] at [phone], select option 4, select option 2, ask to apply for  Patient Assistance Program. Meredeth Ide will ask your household income, and how many people  are in your household. They will quote your out-of-pocket cost based on that information.  [employer] will also be able to set up a 68-month, interest-free payment plan if needed.  Applying the monitor   Shave hair from upper left chest.  Hold abrader disc by orange tab. Rub abrader in 40 strokes over the upper left chest as  indicated in your monitor instructions.  Clean area with 4 enclosed alcohol pads. Let dry.  Apply patch as indicated in monitor instructions. Patch will be placed under collarbone on left  side of chest with arrow pointing upward.  Rub patch adhesive wings for 2 minutes. Remove white label marked "1". Remove the white  label marked "2". Rub patch adhesive wings for 2 additional minutes.  While looking in a mirror, press and release button in center of patch. A small green light will  flash 3-4 times. This will be your only indicator that the monitor has been turned on.  Do not shower  for the first 24 hours. You may shower after the first 24 hours.  Press the button if you feel a symptom. You will hear a small click. Record Date, Time and  Symptom in the Patient Logbook.  When you are ready to remove the patch, follow instructions on the last 2 pages of Patient  Logbook. Stick patch monitor onto the last page of Patient Logbook.  Place Patient Logbook in the blue and white box. Use locking tab on box and  tape box closed  securely. The blue and white box has prepaid postage on it. Please place it in the mailbox as  soon as possible. Your physician should have your test results approximately 7 days after the  monitor has been mailed back to [facility].  Call [facility] Customer Care at [phone] if you have questions regarding  your ZIO XT patch monitor. Call them immediately if you see an orange light blinking on your  monitor.  If your monitor falls off in less than 4 days, contact our Monitor department at [phone].  If your monitor becomes loose or falls off after 4 days call [employer] at [phone] for  suggestions on securing your monitor   Follow-Up: At [facility], you and your health needs are our priority.  As part of our continuing mission to provide you with exceptional heart care, we have created designated Provider Care Teams.  These Care Teams include your primary Cardiologist (physician) and Advanced Practice Providers (APPs -  Physician Assistants and Nurse Practitioners) who all work together to provide you with the care you need, when you need it.  We recommend signing up for the patient portal called "MyChart".  Sign up information is provided on this After Visit Summary.  MyChart is used to connect with patients for Virtual Visits (Telemedicine).  Patients are able to view lab/test results, encounter notes, upcoming appointments, etc.  Non-urgent messages can be sent to your provider as well.   To learn more about what you can do with MyChart, go to [email or web address].    Your next appointment:   As Needed   The format for your next appointment:   In Person  Provider:   Rollene Rotunda, MD    Other Instructions

## 2021-10-18 NOTE — Addendum Note (Signed)
Addended by: Dorris Fetch on: 10/18/2021 04:08 PM   Modules accepted: Orders

## 2021-10-18 NOTE — Progress Notes (Unsigned)
Enrolled for [employer] to mail a ZIO XT long term holter monitor to the patients address on file.  

## 2021-10-19 LAB — TSH: TSH: 1.28 u[IU]/mL (ref 0.450–4.500)

## 2021-10-20 DIAGNOSIS — R55 Syncope and collapse: Secondary | ICD-10-CM | POA: Diagnosis not present

## 2021-10-22 ENCOUNTER — Encounter: Payer: Self-pay | Admitting: *Deleted

## 2021-10-25 ENCOUNTER — Encounter: Payer: Self-pay | Admitting: *Deleted

## 2021-10-25 ENCOUNTER — Other Ambulatory Visit (HOSPITAL_BASED_OUTPATIENT_CLINIC_OR_DEPARTMENT_OTHER): Payer: PRIVATE HEALTH INSURANCE

## 2021-10-26 ENCOUNTER — Ambulatory Visit: Payer: PRIVATE HEALTH INSURANCE | Admitting: Neurology

## 2021-10-26 ENCOUNTER — Encounter: Payer: Self-pay | Admitting: Neurology

## 2021-10-26 VITALS — BP 141/89 | HR 63 | Ht 68.0 in | Wt 145.5 lb

## 2021-10-26 DIAGNOSIS — R55 Syncope and collapse: Secondary | ICD-10-CM | POA: Diagnosis not present

## 2021-10-26 NOTE — Patient Instructions (Signed)
Routine EEG, I will contact you after completion of the test  ?Follow with cardiology  ?Follow up with your primary care physician  ?Return if worse  ?
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
## 2021-10-29 ENCOUNTER — Ambulatory Visit: Payer: PRIVATE HEALTH INSURANCE | Admitting: Internal Medicine

## 2021-10-30 ENCOUNTER — Other Ambulatory Visit: Payer: Self-pay

## 2021-10-30 ENCOUNTER — Ambulatory Visit (INDEPENDENT_AMBULATORY_CARE_PROVIDER_SITE_OTHER): Payer: PRIVATE HEALTH INSURANCE

## 2021-10-30 DIAGNOSIS — R55 Syncope and collapse: Secondary | ICD-10-CM

## 2021-10-30 LAB — ECHOCARDIOGRAM COMPLETE
AR max vel: 3.01 cm2
AV Area VTI: 3.04 cm2
AV Area mean vel: 3.14 cm2
AV Mean grad: 2 mmHg
AV Peak grad: 4.6 mmHg
Ao pk vel: 1.07 m/s
Area-P 1/2: 2.86 cm2
S' Lateral: 2.81 cm

## 2021-11-01 ENCOUNTER — Other Ambulatory Visit: Payer: Self-pay

## 2021-11-01 ENCOUNTER — Ambulatory Visit: Payer: PRIVATE HEALTH INSURANCE | Admitting: Neurology

## 2021-11-01 DIAGNOSIS — R55 Syncope and collapse: Secondary | ICD-10-CM

## 2021-11-02 NOTE — Procedures (Signed)
? ? ?  History: ? ?61 year old man with syncope  ? ?EEG classification: Awake and drowsy ? ?Description of the recording: The background rhythms of this recording consists of a fairly well modulated medium amplitude alpha rhythm of 10 Hz that is reactive to eye opening and closure. As the record progresses, the patient appears to remain in the waking state throughout the recording. Photic stimulation was performed, did not show any abnormalities. Hyperventilation was also performed, did not show any abnormalities. Toward the end of the recording, the patient enters the drowsy state with slight symmetric slowing seen. The patient never enters stage II sleep. No abnormal epileptiform discharges seen during this recording. There was no focal slowing. EKG monitor shows no evidence of cardiac rhythm abnormalities with a heart rate of 66. ? ?Impression: This is a normal EEG recording in the waking and drowsy state. No evidence of interictal epileptiform discharges seen. A normal EEG does not exclude a diagnosis of epilepsy.  ? ? ?Windell Norfolk, MD ?[facility] ?  ?

## 2021-11-05 ENCOUNTER — Encounter: Payer: Self-pay | Admitting: *Deleted

## 2021-11-05 ENCOUNTER — Telehealth: Payer: Self-pay | Admitting: *Deleted

## 2021-11-05 NOTE — Progress Notes (Signed)
Please call and inform patient that his recent EEG (Brain wave test) was normal. In particular, there were no epileptiform discharges and no seizures. No further action is required on this test at this time. Please keep any upcoming appointments or tests and  call us with any interim questions, concerns, problems or updates. Thanks,   Joselynn Amoroso, MD 

## 2021-11-05 NOTE — Telephone Encounter (Signed)
-----   Message from Alric Ran, MD sent at 11/05/2021  8:29 AM EDT ----- ?Please call and inform patient that his recent EEG (Brain wave test) was normal. In particular, there were no epileptiform discharges and no seizures. No further action is required on this test at this time. Please keep any upcoming appointments or tests and  call us with any interim questions, concerns, problems or updates. Thanks,  ? ?Alric Ran, MD ?  ?

## 2021-11-05 NOTE — Telephone Encounter (Signed)
See other encounter.

## 2021-11-05 NOTE — Telephone Encounter (Signed)
Pt call for test results. Please call [phone] ?

## 2021-11-05 NOTE — Telephone Encounter (Signed)
Attempted to call pt, LVM for normal EEG results per DPR. Ask pt to call back for questions or concerns. 

## 2021-11-22 ENCOUNTER — Telehealth: Payer: Self-pay | Admitting: *Deleted
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
## 2022-07-17 ENCOUNTER — Ambulatory Visit (INDEPENDENT_AMBULATORY_CARE_PROVIDER_SITE_OTHER): Payer: PRIVATE HEALTH INSURANCE | Admitting: Family Medicine

## 2022-07-17 ENCOUNTER — Encounter: Payer: Self-pay | Admitting: Family Medicine

## 2022-07-17 VITALS — BP 116/70 | HR 88 | Temp 97.5°F | Ht 68.0 in | Wt 140.0 lb

## 2022-07-17 DIAGNOSIS — Z720 Tobacco use: Secondary | ICD-10-CM

## 2022-07-17 DIAGNOSIS — J22 Unspecified acute lower respiratory infection: Secondary | ICD-10-CM | POA: Diagnosis not present

## 2022-07-17 DIAGNOSIS — Z0000 Encounter for general adult medical examination without abnormal findings: Secondary | ICD-10-CM

## 2022-07-17 DIAGNOSIS — Z0001 Encounter for general adult medical examination with abnormal findings: Secondary | ICD-10-CM

## 2022-07-17 LAB — URINALYSIS, ROUTINE W REFLEX MICROSCOPIC
Bilirubin Urine: NEGATIVE
Hgb urine dipstick: NEGATIVE
Ketones, ur: NEGATIVE
Leukocytes,Ua: NEGATIVE
Nitrite: NEGATIVE
RBC / HPF: NONE SEEN (ref 0–?)
Specific Gravity, Urine: <=1.005 — AB (ref 1.000–1.030)
Total Protein, Urine: NEGATIVE
Urine Glucose: NEGATIVE
Urobilinogen, UA: 0.2 (ref 0.0–1.0)
WBC, UA: NONE SEEN (ref 0–?)
pH: 6 (ref 5.0–8.0)

## 2022-07-17 LAB — CBC
HCT: 44.8 % (ref 39.0–52.0)
Hemoglobin: 15.3 g/dL (ref 13.0–17.0)
MCHC: 34.3 g/dL (ref 30.0–36.0)
MCV: 96.1 fl (ref 78.0–100.0)
Platelets: 314 10*3/uL (ref 150.0–400.0)
RBC: 4.66 Mil/uL (ref 4.22–5.81)
RDW: 12.8 % (ref 11.5–15.5)
WBC: 8.1 10*3/uL (ref 4.0–10.5)

## 2022-07-17 LAB — PSA: PSA: 0.93 ng/mL (ref 0.10–4.00)

## 2022-07-17 LAB — LDL CHOLESTEROL, DIRECT: Direct LDL: 98 mg/dL

## 2022-07-17 MED ORDER — AMOXICILLIN 875 MG PO TABS: 875.0000 mg | ORAL_TABLET | Freq: Two times a day (BID) | ORAL | 0 refills | Status: AC

## 2022-07-17 MED ORDER — HYDROCODONE BIT-HOMATROP MBR 5-1.5 MG/5ML PO SOLN: 5.0000 mL | Freq: Every evening | ORAL | 0 refills | Status: DC | PRN

## 2022-07-17 NOTE — Progress Notes (Signed)
Established Patient Office Visit  Subjective   Patient ID: Larry Sparks, male    DOB: 08/08/1961  Age: 61 y.o. MRN: [id number]  Chief Complaint  Patient presents with   Establish Care    NP/establish care concerns about thick green mucus that taste like sulfa, cold sweats a lot. Patient not fasting.     HPI here this morning for a physical exam and follow-up of a respiratory tract infection.  He is accompanied by his wife who is also my patient.  He gives a month-long history of cough productive of green phlegm.  There is been no fever but he experiences cold sweats.  Denies tightness or wheezing.  He has no asthma history.  He has smoked since he was 61 years old.  Tobacco is colonopathy down.  He smokes marijuana regularly.  He has access to dental care but does not go.  He is very active on his job.  He is not fasting this morning.    Review of Systems  Constitutional: Negative.   HENT: Negative.    Eyes:  Negative for blurred vision, discharge and redness.  Respiratory:  Positive for cough and sputum production. Negative for hemoptysis, shortness of breath and wheezing.   Cardiovascular: Negative.   Gastrointestinal:  Negative for abdominal pain, blood in stool, constipation, diarrhea and melena.  Genitourinary: Negative.  Negative for dysuria, frequency, hematuria and urgency.  Musculoskeletal: Negative.  Negative for myalgias.  Skin:  Negative for rash.  Neurological:  Negative for tingling, loss of consciousness and weakness.  Endo/Heme/Allergies:  Negative for polydipsia.      07/17/2022    8:22 AM  Depression screen PHQ 2/9  Decreased Interest 0  Down, Depressed, Hopeless 0  PHQ - 2 Score 0       Objective:     BP 116/70 (BP Location: Right Arm, Patient Position: Sitting, Cuff Size: Normal)   Pulse 88   Temp (!) 97.5 F (36.4 C) (Temporal)   Ht 5\' 8"  (1.727 m)   Wt 140 lb (63.5 kg)   SpO2 96%   BMI 21.29 kg/m    Physical Exam Constitutional:       General: He is not in acute distress.    Appearance: Normal appearance. He is not ill-appearing, toxic-appearing or diaphoretic.  HENT:     Head: Normocephalic and atraumatic.     Right Ear: External ear normal.     Left Ear: External ear normal.     Mouth/Throat:     Mouth: Mucous membranes are moist.     Dentition: Abnormal dentition.     Pharynx: Oropharynx is clear. No oropharyngeal exudate or posterior oropharyngeal erythema.  Eyes:     General: No scleral icterus.       Right eye: No discharge.        Left eye: No discharge.     Extraocular Movements: Extraocular movements intact.     Conjunctiva/sclera: Conjunctivae normal.     Pupils: Pupils are equal, round, and reactive to light.  Cardiovascular:     Rate and Rhythm: Normal rate and regular rhythm.  Pulmonary:     Effort: Pulmonary effort is normal. No respiratory distress.     Breath sounds: Normal breath sounds. Decreased air movement present. No wheezing or rales.  Abdominal:     General: Bowel sounds are normal. There is no distension.     Tenderness: There is no abdominal tenderness. There is no guarding.  Musculoskeletal:     Cervical back: No  rigidity or tenderness.     Right lower leg: No edema.     Left lower leg: No edema.  Skin:    General: Skin is warm and dry.  Neurological:     Mental Status: He is alert and oriented to person, place, and time.  Psychiatric:        Mood and Affect: Mood normal.        Behavior: Behavior normal.      No results found for any visits on 07/17/22.    The ASCVD Risk score (Arnett DK, et al., 2019) failed to calculate for the following reasons:   Cannot find a previous HDL lab   Cannot find a previous total cholesterol lab    Assessment & Plan:   Problem List Items Addressed This Visit   None Visit Diagnoses     Healthcare maintenance    -  Primary   Relevant Orders   LDL cholesterol, direct   PSA   Urinalysis, Routine w reflex microscopic   Ambulatory  referral to Gastroenterology   Lower respiratory tract infection       Relevant Medications   amoxicillin (AMOXIL) 875 MG tablet   HYDROcodone bit-homatropine (HYDROMET) 5-1.5 MG/5ML syrup   Other Relevant Orders   CBC   Tobacco abuse       Relevant Orders   Ambulatory Referral Lung Cancer Screening [facility]       Return in about 6 months (around 01/15/2023), or if symptoms worsen or fail to improve.  Encouraged tobacco cessation.  Information was given on strategies for quitting.  Agrees to go for screening CT of chest..  Agrees to go for consultation for his first colonoscopy.  Encouraged regular dental care.  Amoxicillin for 10 days for lower respiratory tract infection.  Return to clinic if he cleared after finishing antibiotic.  Information was given on health maintenance and disease prevention.  Mliss Sax, MD

## 2022-07-31 ENCOUNTER — Other Ambulatory Visit: Payer: Self-pay | Admitting: *Deleted

## 2022-07-31 DIAGNOSIS — F17210 Nicotine dependence, cigarettes, uncomplicated: Secondary | ICD-10-CM

## 2022-07-31 DIAGNOSIS — Z122 Encounter for screening for malignant neoplasm of respiratory organs: Secondary | ICD-10-CM

## 2022-07-31 DIAGNOSIS — Z87891 Personal history of nicotine dependence: Secondary | ICD-10-CM

## 2022-08-01 ENCOUNTER — Encounter: Payer: Self-pay | Admitting: Family Medicine

## 2022-08-01 ENCOUNTER — Ambulatory Visit (INDEPENDENT_AMBULATORY_CARE_PROVIDER_SITE_OTHER)
Admission: RE | Admit: 2022-08-01 | Discharge: 2022-08-01 | Disposition: A | Discharge status: Home / Self Care | Payer: PRIVATE HEALTH INSURANCE | Source: Ambulatory Visit | Attending: Family Medicine | Admitting: Family Medicine

## 2022-08-01 ENCOUNTER — Ambulatory Visit (INDEPENDENT_AMBULATORY_CARE_PROVIDER_SITE_OTHER): Payer: PRIVATE HEALTH INSURANCE | Admitting: Family Medicine

## 2022-08-01 VITALS — BP 120/82 | HR 91 | Temp 97.4°F | Ht 68.0 in | Wt 138.8 lb

## 2022-08-01 DIAGNOSIS — J22 Unspecified acute lower respiratory infection: Secondary | ICD-10-CM | POA: Diagnosis not present

## 2022-08-01 DIAGNOSIS — J4520 Mild intermittent asthma, uncomplicated: Secondary | ICD-10-CM

## 2022-08-01 DIAGNOSIS — J45909 Unspecified asthma, uncomplicated: Secondary | ICD-10-CM | POA: Insufficient documentation

## 2022-08-01 MED ORDER — PREDNISONE 20 MG PO TABS: 20.0000 mg | ORAL_TABLET | Freq: Two times a day (BID) | ORAL | 0 refills | Status: AC

## 2022-08-01 MED ORDER — BENZONATATE 200 MG PO CAPS: 200.0000 mg | ORAL_CAPSULE | Freq: Two times a day (BID) | ORAL | 0 refills | Status: DC | PRN

## 2022-08-01 MED ORDER — DOXYCYCLINE HYCLATE 100 MG PO TABS: 100.0000 mg | ORAL_TABLET | Freq: Two times a day (BID) | ORAL | 0 refills | Status: AC

## 2022-08-01 NOTE — Progress Notes (Signed)
Established Patient Office Visit   Subjective:  Patient ID: Larry Sparks, male    DOB: [birth year]  Age: 61 y.o. MRN: [id number]  Chief Complaint  Patient presents with   chest congestion    Cough and chest congestion not improved after medication.     HPI Encounter Diagnoses  Name Primary?   Lower respiratory tract infection Yes   Mild intermittent reactive airway disease without complication    For follow-up of existing cough productive of green phlegm.  Denies fevers chills or malaise.  Actually feels well despite his current symptoms.  There is now some wheezing and tightness.  Scheduled to see pulmonary at the end of January.   Review of Systems  Constitutional: Negative.   HENT: Negative.    Eyes:  Negative for blurred vision, discharge and redness.  Respiratory:  Positive for cough, sputum production and wheezing.   Cardiovascular: Negative.   Gastrointestinal:  Negative for abdominal pain.  Genitourinary: Negative.   Musculoskeletal: Negative.  Negative for myalgias.  Skin:  Negative for rash.  Neurological:  Negative for tingling, loss of consciousness and weakness.  Endo/Heme/Allergies:  Negative for polydipsia.     Current Outpatient Medications:    benzonatate (TESSALON) 200 MG capsule, Take 1 capsule (200 mg total) by mouth 2 (two) times daily as needed for cough., Disp: 20 capsule, Rfl: 0   doxycycline (VIBRA-TABS) 100 MG tablet, Take 1 tablet (100 mg total) by mouth 2 (two) times daily for 10 days., Disp: 20 tablet, Rfl: 0   predniSONE (DELTASONE) 20 MG tablet, Take 1 tablet (20 mg total) by mouth 2 (two) times daily with a meal for 7 days., Disp: 14 tablet, Rfl: 0   HYDROcodone bit-homatropine (HYDROMET) 5-1.5 MG/5ML syrup, Take 5 mLs by mouth at bedtime as needed for cough. (Patient not taking: Reported on 08/01/2022), Disp: 120 mL, Rfl: 0   Objective:     BP 120/82 (BP Location: Left Arm, Patient Position: Sitting, Cuff Size: Normal)   Pulse 91    Temp (!) 97.4 F (36.3 C) (Temporal)   Ht 5\' 8"  (1.727 m)   Wt 138 lb 12.8 oz (63 kg)   SpO2 99%   BMI 21.10 kg/m    Physical Exam Constitutional:      General: He is not in acute distress.    Appearance: Normal appearance. He is not ill-appearing, toxic-appearing or diaphoretic.  HENT:     Head: Normocephalic and atraumatic.     Right Ear: External ear normal.     Left Ear: External ear normal.     Mouth/Throat:     Mouth: Mucous membranes are moist.     Pharynx: Oropharynx is clear. No oropharyngeal exudate or posterior oropharyngeal erythema.  Eyes:     General: No scleral icterus.       Right eye: No discharge.        Left eye: No discharge.     Extraocular Movements: Extraocular movements intact.     Conjunctiva/sclera: Conjunctivae normal.     Pupils: Pupils are equal, round, and reactive to light.  Cardiovascular:     Rate and Rhythm: Normal rate and regular rhythm.  Pulmonary:     Effort: Pulmonary effort is normal. No respiratory distress.     Breath sounds: Decreased air movement present. Decreased breath sounds present. No rales.  Musculoskeletal:     Cervical back: No rigidity or tenderness.  Skin:    General: Skin is warm and dry.  Neurological:  Mental Status: He is alert and oriented to person, place, and time.  Psychiatric:        Mood and Affect: Mood normal.        Behavior: Behavior normal.      No results found for any visits on 08/01/22.    The ASCVD Risk score (Arnett DK, et al., 2019) failed to calculate for the following reasons:   Cannot find a previous HDL lab   Cannot find a previous total cholesterol lab    Assessment & Plan:   Lower respiratory tract infection -     Doxycycline Hyclate; Take 1 tablet (100 mg total) by mouth 2 (two) times daily for 10 days.  Dispense: 20 tablet; Refill: 0 -     Benzonatate; Take 1 capsule (200 mg total) by mouth 2 (two) times daily as needed for cough.  Dispense: 20 capsule; Refill: 0 -     DG  Chest 2 View; Future  Mild intermittent reactive airway disease without complication -     predniSONE; Take 1 tablet (20 mg total) by mouth 2 (two) times daily with a meal for 7 days.  Dispense: 14 tablet; Refill: 0 -     Benzonatate; Take 1 capsule (200 mg total) by mouth 2 (two) times daily as needed for cough.  Dispense: 20 capsule; Refill: 0    Return in about 2 weeks (around 08/15/2022), or if symptoms worsen or fail to improve.  Again reminded him that his current signs and symptoms are worsened when the patient smokes.  Reminded him that he has taken 2 trips to the doctor for this issue.  Mliss Sax, MD

## 2022-08-28 ENCOUNTER — Telehealth: Payer: Self-pay | Admitting: Family Medicine

## 2022-08-28 ENCOUNTER — Ambulatory Visit: Payer: PRIVATE HEALTH INSURANCE | Admitting: Family Medicine

## 2022-08-28 NOTE — Telephone Encounter (Signed)
Pt will be a no show for an OV with Dr Ethelene Hal on 08/28/22, he had to take his wife to the hospital. I did not send a letter

## 2022-08-30 NOTE — Telephone Encounter (Signed)
did not count as no show, fee waived, pt had to take wife to hospital

## 2022-09-18 ENCOUNTER — Encounter: Payer: Self-pay | Admitting: Acute Care

## 2022-09-18 ENCOUNTER — Ambulatory Visit: Payer: PRIVATE HEALTH INSURANCE | Admitting: Acute Care

## 2022-09-18 DIAGNOSIS — F17210 Nicotine dependence, cigarettes, uncomplicated: Secondary | ICD-10-CM | POA: Diagnosis not present

## 2022-09-18 NOTE — Progress Notes (Signed)
Virtual Visit via Telephone Note  I connected with Larry Sparks on 09/18/22 at  8:30 AM EST by telephone and verified that I am speaking with the correct person using two identifiers.  Location: Patient:  At home Provider: [address], [place], [place], [address]    I discussed the limitations, risks, security and privacy concerns of performing an evaluation and management service by telephone and the availability of in person appointments. I also discussed with the patient that there may be a patient responsible charge related to this service. The patient expressed understanding and agreed to proceed.    Shared Decision Making Visit Lung Cancer Screening Program [phone])   Eligibility: Age 62 y.o. Pack Years Smoking History Calculation 47 pack year smoking history (# packs/per year x # years smoked) Recent History of coughing up blood  no Unexplained weight loss? no ( >Than 15 pounds within the last 6 months ) Prior History Lung / other cancer no (Diagnosis within the last 5 years already requiring surveillance chest CT Scans). Smoking Status Current Smoker Former Smokers: Years since quit: NA  Quit Date: NA  Visit Components: Discussion included one or more decision making aids. yes Discussion included risk/benefits of screening. yes Discussion included potential follow up diagnostic testing for abnormal scans. yes Discussion included meaning and risk of over diagnosis. yes Discussion included meaning and risk of False Positives. yes Discussion included meaning of total radiation exposure. yes  Counseling Included: Importance of adherence to annual lung cancer LDCT screening. yes Impact of comorbidities on ability to participate in the program. yes Ability and willingness to under diagnostic treatment. yes  Smoking Cessation Counseling: Current Smokers:  Discussed importance of smoking cessation. yes Information about tobacco cessation classes and  interventions provided to patient. yes Patient provided with "ticket" for LDCT Scan. yes Symptomatic Patient. no  Counseling NA Diagnosis Code: Tobacco Use Z72.0 Asymptomatic Patient yes  Counseling (Intermediate counseling: > three minutes counseling) F7510 Former Smokers:  Discussed the importance of maintaining cigarette abstinence. yes Diagnosis Code: Personal History of Nicotine Dependence. C58.527 Information about tobacco cessation classes and interventions provided to patient. Yes Patient provided with "ticket" for LDCT Scan. yes Written Order for Lung Cancer Screening with LDCT placed in Epic. Yes (CT Chest Lung Cancer Screening Low Dose W/O CM) [id number] Z12.2-Screening of respiratory organs Z87.891-Personal history of nicotine dependence  I have spent 25 minutes of face to face/ virtual visit   time with  Larry Sparks discussing the risks and benefits of lung cancer screening. We viewed / discussed a power point together that explained in detail the above noted topics. We paused at intervals to allow for questions to be asked and answered to ensure understanding.We discussed that the single most powerful action that he can take to decrease his risk of developing lung cancer is to quit smoking. We discussed whether or not he is ready to commit to setting a quit date. We discussed options for tools to aid in quitting smoking including nicotine replacement therapy, non-nicotine medications, support groups, Quit Smart classes, and behavior modification. We discussed that often times setting smaller, more achievable goals, such as eliminating 1 cigarette a day for a week and then 2 cigarettes a day for a week can be helpful in slowly decreasing the number of cigarettes smoked. This allows for a sense of accomplishment as well as providing a clinical benefit. I provided  him  with smoking cessation  information  with contact information for community resources, classes, free  nicotine replacement  therapy, and access to mobile apps, text messaging, and on-line smoking cessation help. I have also provided  him  the office contact information in the event he needs to contact me, or the screening staff. We discussed the time and location of the scan, and that either Larry Glassman RN, Larry Prince, RN  or I will call / send a letter with the results within 24-72 hours of receiving them. The patient verbalized understanding of all of  the above and had no further questions upon leaving the office. They have my contact information in the event they have any further questions.  I spent 3 minutes counseling on smoking cessation and the health risks of continued tobacco abuse.  I explained to the patient that there has been a high incidence of coronary artery disease noted on these exams. I explained that this is a non-gated exam therefore degree or severity cannot be determined. This patient is not on statin therapy. I have asked the patient to follow-up with their PCP regarding any incidental finding of coronary artery disease and management with diet or medication as their PCP  feels is clinically indicated. The patient verbalized understanding of the above and had no further questions upon completion of the visit.      Magdalen Spatz, NP 09/18/2022

## 2022-09-18 NOTE — Patient Instructions (Signed)
Thank you for participating in the [facility]. It was our pleasure to meet you today. We will call you with the results of your scan within the next few days. Your scan will be assigned a Lung RADS category score by the physicians reading the scans.  This Lung RADS score determines follow up scanning.  See below for description of categories, and follow up screening recommendations. We will be in touch to schedule your follow up screening annually or based on recommendations of our providers. We will fax a copy of your scan results to your Primary Care Physician, or the physician who referred you to the program, to ensure they have the results. Please call the office if you have any questions or concerns regarding your scanning experience or results.  Our office number is [phone]. Please speak with Denise Phelps, RN. , or  Denise Buckner RN, They are  our Lung Cancer Screening RN.'s If They are unavailable when you call, Please leave a message on the voice mail. We will return your call at our earliest convenience.This voice mail is monitored several times a day.  Remember, if your scan is normal, we will scan you annually as long as you continue to meet the criteria for the program. (Age 50-80, Current smoker or smoker who has quit within the last 15 years). If you are a smoker, remember, quitting is the single most powerful action that you can take to decrease your risk of lung cancer and other pulmonary, breathing related problems. We know quitting is hard, and we are here to help.  Please let us know if there is anything we can do to help you meet your goal of quitting. If you are a former smoker, congratulations. We are proud of you! Remain smoke free! Remember you can refer friends or family members through the number above.  We will screen them to make sure they meet criteria for the program. Thank you for helping us take better care of you by  participating in Lung Screening.  You can receive free nicotine replacement therapy ( patches, gum or mints) by calling 1-800-QUIT NOW. Please call so we can get you on the path to becoming  a non-smoker. I know it is hard, but you can do this!  Lung RADS Categories:  Lung RADS 1: no nodules or definitely non-concerning nodules.  Recommendation is for a repeat annual scan in 12 months.  Lung RADS 2:  nodules that are non-concerning in appearance and behavior with a very low likelihood of becoming an active cancer. Recommendation is for a repeat annual scan in 12 months.  Lung RADS 3: nodules that are probably non-concerning , includes nodules with a low likelihood of becoming an active cancer.  Recommendation is for a 6-month repeat screening scan. Often noted after an upper respiratory illness. We will be in touch to make sure you have no questions, and to schedule your 6-month scan.  Lung RADS 4 A: nodules with concerning findings, recommendation is most often for a follow up scan in 3 months or additional testing based on our provider's assessment of the scan. We will be in touch to make sure you have no questions and to schedule the recommended 3 month follow up scan.  Lung RADS 4 B:  indicates findings that are concerning. We will be in touch with you to schedule additional diagnostic testing based on our provider's  assessment of the scan.  Other options for assistance in smoking cessation (   As covered by your insurance benefits)  Hypnosis for smoking cessation  [employer]. [phone]  Acupuncture for smoking cessation  [facility] [employer] [phone]

## 2022-09-19 ENCOUNTER — Ambulatory Visit
Admission: RE | Admit: 2022-09-19 | Discharge: 2022-09-19 | Disposition: A | Discharge status: Home / Self Care | Payer: PRIVATE HEALTH INSURANCE | Source: Ambulatory Visit | Attending: Acute Care | Admitting: Acute Care

## 2022-09-19 DIAGNOSIS — F17210 Nicotine dependence, cigarettes, uncomplicated: Secondary | ICD-10-CM

## 2022-09-19 DIAGNOSIS — Z87891 Personal history of nicotine dependence: Secondary | ICD-10-CM

## 2022-09-19 DIAGNOSIS — Z122 Encounter for screening for malignant neoplasm of respiratory organs: Secondary | ICD-10-CM

## 2022-09-20 ENCOUNTER — Other Ambulatory Visit: Payer: Self-pay | Admitting: Acute Care

## 2022-09-20 DIAGNOSIS — F17210 Nicotine dependence, cigarettes, uncomplicated: Secondary | ICD-10-CM

## 2022-09-20 DIAGNOSIS — Z122 Encounter for screening for malignant neoplasm of respiratory organs: Secondary | ICD-10-CM

## 2022-09-20 DIAGNOSIS — Z87891 Personal history of nicotine dependence: Secondary | ICD-10-CM

## 2023-01-15 ENCOUNTER — Encounter: Payer: Self-pay | Admitting: Family Medicine

## 2023-01-15 ENCOUNTER — Ambulatory Visit: Payer: PRIVATE HEALTH INSURANCE | Admitting: Family Medicine

## 2023-01-15 VITALS — BP 134/70 | HR 71 | Temp 98.5°F | Ht 68.0 in | Wt 144.0 lb

## 2023-01-15 DIAGNOSIS — I2584 Coronary atherosclerosis due to calcified coronary lesion: Secondary | ICD-10-CM | POA: Diagnosis not present

## 2023-01-15 DIAGNOSIS — I2510 Atherosclerotic heart disease of native coronary artery without angina pectoris: Secondary | ICD-10-CM

## 2023-01-15 DIAGNOSIS — Z720 Tobacco use: Secondary | ICD-10-CM

## 2023-01-15 MED ORDER — ATORVASTATIN CALCIUM 10 MG PO TABS: 10.0000 mg | ORAL_TABLET | Freq: Every day | ORAL | 3 refills | Status: DC

## 2023-01-15 MED ORDER — VARENICLINE TARTRATE 0.5 MG PO TABS: ORAL_TABLET | ORAL | 0 refills | Status: DC

## 2023-01-15 NOTE — Progress Notes (Signed)
Established Patient Office Visit   Subjective:  Patient ID: Larry Sparks, male    DOB: June 05, 1961  Age: 62 y.o. MRN: [id number]  Chief Complaint  Patient presents with   Medical Management of Chronic Issues    6 month follow up, no concerns. Patient not fasting.     HPI Encounter Diagnoses  Name Primary?   Tobacco abuse Yes   Coronary artery calcification    Follow-up of screening CT that showed centrilobular emphysema, coronary artery calcification.  His wife's heart surgeon explained that her delayed healing was due to to the secondhand smoke she was exposed to from the patient's smoking.  He is ready to quit and motivated.  He is smoking 10 cigarettes a day but has that first cigarette each morning soon after awakening.  He is experiencing no chest pain, shortness of breath or DOE.  He is quite active on his job in [employer].   Review of Systems  Constitutional: Negative.   HENT: Negative.    Eyes:  Negative for blurred vision, discharge and redness.  Respiratory: Negative.  Negative for shortness of breath.   Cardiovascular: Negative.   Gastrointestinal:  Negative for abdominal pain.  Genitourinary: Negative.   Musculoskeletal: Negative.  Negative for myalgias.  Skin:  Negative for rash.  Neurological:  Negative for tingling, loss of consciousness and weakness.  Endo/Heme/Allergies:  Negative for polydipsia.     Current Outpatient Medications:    atorvastatin (LIPITOR) 10 MG tablet, Take 1 tablet (10 mg total) by mouth daily., Disp: 90 tablet, Rfl: 3   varenicline (CHANTIX) 0.5 MG tablet, Take 1 tablet (0.5 mg total) by mouth daily for 3 days, THEN 1 tablet (0.5 mg total) 2 (two) times daily for 7 days, THEN 2 tablets (1 mg total) 2 (two) times daily for 28 days. Pick a quit date and start above 7 days before that day.., Disp: 129 tablet, Rfl: 0   Objective:     BP 134/70 (BP Location: Right Arm, Patient Position: Sitting, Cuff Size: Normal)   Pulse 71   Temp 98.5 F  (36.9 C) (Temporal)   Ht 5\' 8"  (1.727 m)   Wt 144 lb (65.3 kg)   SpO2 99%   BMI 21.90 kg/m    Physical Exam Constitutional:      General: He is not in acute distress.    Appearance: Normal appearance. He is not ill-appearing, toxic-appearing or diaphoretic.  HENT:     Head: Normocephalic and atraumatic.     Right Ear: External ear normal.     Left Ear: External ear normal.  Eyes:     General: No scleral icterus.       Right eye: No discharge.        Left eye: No discharge.     Extraocular Movements: Extraocular movements intact.     Conjunctiva/sclera: Conjunctivae normal.  Pulmonary:     Effort: Pulmonary effort is normal. No respiratory distress.  Skin:    General: Skin is warm and dry.  Neurological:     Mental Status: He is alert and oriented to person, place, and time.  Psychiatric:        Mood and Affect: Mood normal.        Behavior: Behavior normal.      No results found for any visits on 01/15/23.    The ASCVD Risk score (Arnett DK, et al., 2019) failed to calculate for the following reasons:   Cannot find a previous HDL lab   Cannot  find a previous total cholesterol lab    Assessment & Plan:   Tobacco abuse -     Varenicline Tartrate; Take 1 tablet (0.5 mg total) by mouth daily for 3 days, THEN 1 tablet (0.5 mg total) 2 (two) times daily for 7 days, THEN 2 tablets (1 mg total) 2 (two) times daily for 28 days. Pick a quit date and start above 7 days before that day..  Dispense: 129 tablet; Refill: 0  Coronary artery calcification -     Atorvastatin Calcium; Take 1 tablet (10 mg total) by mouth daily.  Dispense: 90 tablet; Refill: 3    Return Follow-up 1 month after starting Chantix..  Patient would like to try Chantix.  He is to start the medication 1 week prior to his quit date.  Will look out for sadness or bad dreams.  Low-dose atorvastatin.  Information was given on Chantix and vascular disease.  Explained that quitting smoking and taking a statin  would go along ways towards preventing heart surgery for him and progression of his COPD.  Mliss Sax, MD

## 2023-01-23 ENCOUNTER — Telehealth: Payer: Self-pay | Admitting: Family Medicine

## 2023-01-23 NOTE — Telephone Encounter (Signed)
Larry Sparks, (Spouse) dropped off paper work for her husband. I have placed form in Dr Evangeline Gula folder upfront. Please advise Larry Sparks when completed at [phone].

## 2023-01-27 NOTE — Telephone Encounter (Signed)
CLINICAL USE BELOW THIS LINE (use X to signify taken)  __X__Form received and placed in providers office for signature. ____Form completed and faxed to LOA Dept. ____Form completed & LVM to notify pt ready for pick up ____Charge sheet & copy of form in front office folder for office supervisor.   

## 2023-01-28 NOTE — Telephone Encounter (Signed)
Called patient for clarity on paperwork that was dropped off. Dr. Doreene Burke not sure what this is for and not aware of patient losing license. No answer LMTCB
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
## 2023-01-29 NOTE — Telephone Encounter (Signed)
Appointment scheduled to come in for clearance

## 2023-02-09 ENCOUNTER — Telehealth: Payer: Self-pay

## 2023-02-09 ENCOUNTER — Other Ambulatory Visit (HOSPITAL_COMMUNITY): Payer: Self-pay

## 2023-02-09 NOTE — Telephone Encounter (Signed)
Pharmacy Patient Advocate Encounter  Received notification from [facility] that prior authorization for Varenicline Tartrate 0.5MG  tablets is required/requested. WAS DENIED FOR MAXIMUM DAILY DOSE OF 2   Per Test Claim: WILL PAY FOR DOSE OF DAILY 2 PER DAY TEST CLAIM REVEALED $0.00 COPAY   If suggested medication is appropriate, Please send in a new RX WITH MAXIMUM DAILY DOSE OF 2. Please advise as to why it's not appropriate so that we may request a Prior Authorization.

## 2023-02-10 ENCOUNTER — Encounter: Payer: Self-pay | Admitting: Family Medicine

## 2023-02-10 ENCOUNTER — Ambulatory Visit (INDEPENDENT_AMBULATORY_CARE_PROVIDER_SITE_OTHER): Payer: PRIVATE HEALTH INSURANCE | Admitting: Family Medicine

## 2023-02-10 VITALS — BP 120/72 | HR 69 | Temp 98.0°F | Ht 68.0 in | Wt 142.4 lb

## 2023-02-10 DIAGNOSIS — I2584 Coronary atherosclerosis due to calcified coronary lesion: Secondary | ICD-10-CM

## 2023-02-10 DIAGNOSIS — Z720 Tobacco use: Secondary | ICD-10-CM | POA: Diagnosis not present

## 2023-02-10 DIAGNOSIS — I2510 Atherosclerotic heart disease of native coronary artery without angina pectoris: Secondary | ICD-10-CM | POA: Diagnosis not present

## 2023-02-10 LAB — COMPREHENSIVE METABOLIC PANEL
ALT: 20 U/L (ref 0–53)
AST: 19 U/L (ref 0–37)
Albumin: 4.8 g/dL (ref 3.5–5.2)
Alkaline Phosphatase: 87 U/L (ref 39–117)
BUN: 13 mg/dL (ref 6–23)
CO2: 28 mEq/L (ref 19–32)
Calcium: 10.1 mg/dL (ref 8.4–10.5)
Chloride: 102 mEq/L (ref 96–112)
Creatinine, Ser: 1.12 mg/dL (ref 0.40–1.50)
GFR: 71.44 mL/min (ref >60.00)
Glucose, Bld: 86 mg/dL (ref 70–99)
Potassium: 4.3 mEq/L (ref 3.5–5.1)
Sodium: 138 mEq/L (ref 135–145)
Total Bilirubin: 0.8 mg/dL (ref 0.2–1.2)
Total Protein: 7.6 g/dL (ref 6.0–8.3)

## 2023-02-10 LAB — LIPID PANEL
Cholesterol: 168 mg/dL (ref 0–200)
HDL: 46.9 mg/dL (ref >39.00)
LDL Cholesterol: 99 mg/dL (ref 0–99)
NonHDL: 121.28
Total CHOL/HDL Ratio: 4
Triglycerides: 110 mg/dL (ref 0.0–149.0)
VLDL: 22 mg/dL (ref 0.0–40.0)

## 2023-02-10 MED ORDER — ATORVASTATIN CALCIUM 10 MG PO TABS: 10.0000 mg | ORAL_TABLET | Freq: Every day | ORAL | 3 refills | Status: DC

## 2023-02-10 MED ORDER — VARENICLINE TARTRATE 0.5 MG PO TABS: ORAL_TABLET | ORAL | 0 refills | Status: DC
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
## 2023-02-11 ENCOUNTER — Telehealth: Payer: Self-pay

## 2023-02-11 DIAGNOSIS — Z720 Tobacco use: Secondary | ICD-10-CM

## 2023-02-11 MED ORDER — VARENICLINE TARTRATE (STARTER) 0.5 MG X 11 & 1 MG X 42 PO TBPK: ORAL_TABLET | ORAL | 0 refills | Status: DC

## 2023-02-11 NOTE — Telephone Encounter (Signed)
Pharmacy calling states that patients insurance will not cover Vareniciline max dose is 2 tabs a day. Recommending patient to start on Chantix does pack please advise.

## 2023-02-14 NOTE — Telephone Encounter (Signed)
Pt's wife is checking on status of this message. She is wondering if this paperwork has been faxed off. Please advise [name] at [phone].

## 2023-02-14 NOTE — Telephone Encounter (Signed)
Called patients wife to inform that paperwork is complete and ready for pick up. No answer LMTCB

## 2023-02-17 ENCOUNTER — Ambulatory Visit (INDEPENDENT_AMBULATORY_CARE_PROVIDER_SITE_OTHER): Payer: PRIVATE HEALTH INSURANCE | Admitting: Family Medicine

## 2023-02-17 ENCOUNTER — Other Ambulatory Visit: Payer: Self-pay | Admitting: Family Medicine

## 2023-02-17 ENCOUNTER — Encounter: Payer: Self-pay | Admitting: Family Medicine

## 2023-02-17 VITALS — BP 118/76 | HR 60 | Temp 98.1°F | Ht 68.0 in | Wt 140.0 lb

## 2023-02-17 DIAGNOSIS — I2584 Coronary atherosclerosis due to calcified coronary lesion: Secondary | ICD-10-CM

## 2023-02-17 DIAGNOSIS — I2510 Atherosclerotic heart disease of native coronary artery without angina pectoris: Secondary | ICD-10-CM | POA: Diagnosis not present

## 2023-02-17 DIAGNOSIS — Z720 Tobacco use: Secondary | ICD-10-CM | POA: Diagnosis not present

## 2023-02-17 MED ORDER — VARENICLINE TARTRATE 1 MG PO TABS: 1.0000 mg | ORAL_TABLET | Freq: Two times a day (BID) | ORAL | 2 refills | Status: DC

## 2023-02-17 NOTE — Progress Notes (Signed)
   Established Patient Office Visit   Subjective:  Patient ID: Larry Sparks, male    DOB: [birth year]  Age: 62 y.o. MRN: [id number]  Chief Complaint  Patient presents with   Medication Management    With Chantix, no concerns    HPI Encounter Diagnoses  Name Primary?   Tobacco abuse Yes   Coronary artery calcification    Returns with his significant other.  She is doing well after her surgery.  He is doing well with the Chantix.  Denies bad dreams or depression.  Continues with atorvastatin.  Denies muscle or joint aches.  He is down to 5 cigarettes daily.   Review of Systems  Constitutional: Negative.   HENT: Negative.    Eyes:  Negative for blurred vision, discharge and redness.  Respiratory: Negative.    Cardiovascular: Negative.   Gastrointestinal:  Negative for abdominal pain.  Genitourinary: Negative.   Musculoskeletal: Negative.  Negative for myalgias.  Skin:  Negative for rash.  Neurological:  Negative for tingling, loss of consciousness and weakness.  Endo/Heme/Allergies:  Negative for polydipsia.     Current Outpatient Medications:    atorvastatin (LIPITOR) 10 MG tablet, Take 1 tablet (10 mg total) by mouth daily., Disp: 90 tablet, Rfl: 3   varenicline (CHANTIX CONTINUING MONTH PAK) 1 MG tablet, Take 1 tablet (1 mg total) by mouth 2 (two) times daily., Disp: 60 tablet, Rfl: 2   Objective:     BP 118/76 (BP Location: Right Arm)   Pulse 60   Temp 98.1 F (36.7 C)   Ht 5\' 8"  (1.727 m)   Wt 140 lb (63.5 kg)   SpO2 97%   BMI 21.29 kg/m    Physical Exam Constitutional:      General: He is not in acute distress.    Appearance: Normal appearance. He is not ill-appearing, toxic-appearing or diaphoretic.  HENT:     Head: Normocephalic and atraumatic.     Right Ear: External ear normal.     Left Ear: External ear normal.  Eyes:     General: No scleral icterus.       Right eye: No discharge.        Left eye: No discharge.     Extraocular Movements:  Extraocular movements intact.     Conjunctiva/sclera: Conjunctivae normal.  Pulmonary:     Effort: Pulmonary effort is normal. No respiratory distress.  Skin:    General: Skin is warm and dry.  Neurological:     Mental Status: He is alert and oriented to person, place, and time.  Psychiatric:        Mood and Affect: Mood normal.        Behavior: Behavior normal.      No results found for any visits on 02/17/23.    The 10-year ASCVD risk score (Arnett DK, et al., 2019) is: 10.7%    Assessment & Plan:   Tobacco abuse -     Varenicline Tartrate; Take 1 tablet (1 mg total) by mouth 2 (two) times daily.  Dispense: 60 tablet; Refill: 2  Coronary artery calcification    Return in about 3 months (around 05/20/2023), or Return fasting..  Continue weaning of tobacco.  May use nicotine lozenges for cravings.  Continue Chantix for 3 more months.  Continue atorvastatin.  Mliss Sax, MD

## 2023-02-18 ENCOUNTER — Telehealth: Payer: Self-pay | Admitting: Cardiology

## 2023-02-18 NOTE — Telephone Encounter (Signed)
Pt came in needing copies of Medical Records (did not state the reason for needing them). I provided him with Medical Records' telephone & fax numbers and a Medical Release form.  J.Britt, 02-18-23

## 2023-02-24 IMAGING — CT CT RENAL STONE PROTOCOL
2 of 4 series · 16 of 46 positions shown, 18 images · non-contrast
Comparison: 02/08/2012

CLINICAL DATA: Right flank pain, persistent

EXAM:
CT ABDOMEN AND PELVIS WITHOUT CONTRAST
TECHNIQUE: Multidetector CT imaging of the abdomen and pelvis was performed
following the standard protocol without IV contrast.

[Series 2: stone full · axial · 0.66mm/px · z∈[+580,+955]mm · 13 of 83 slices shown, 15 images]
[im 4/83  soft-tissue]
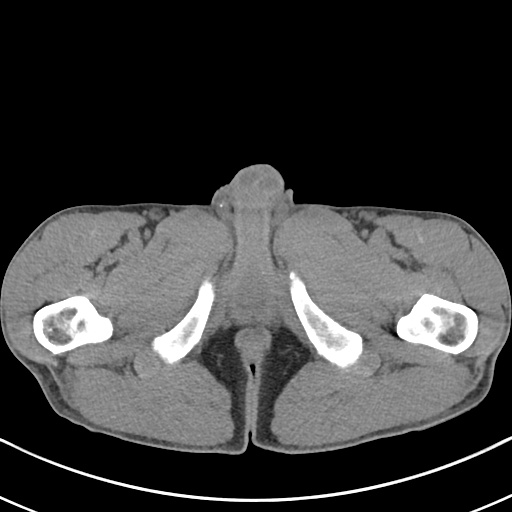
[im 4/83  bone]
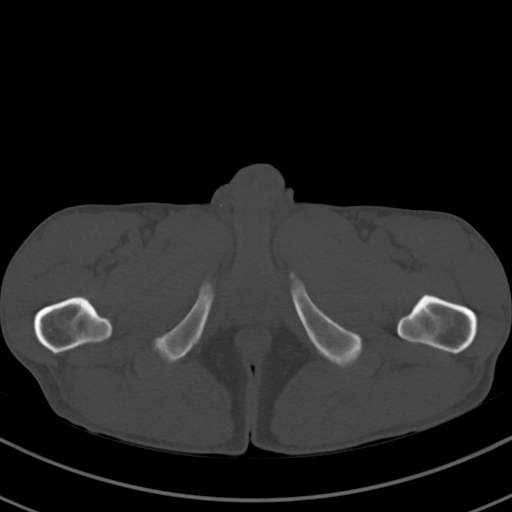
[im 11/83  soft-tissue]
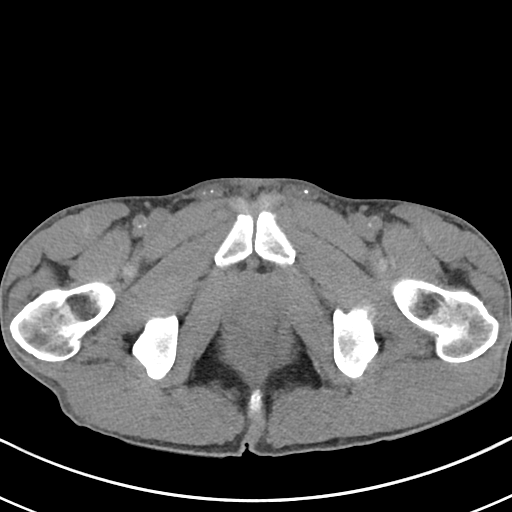
[im 18/83  soft-tissue]
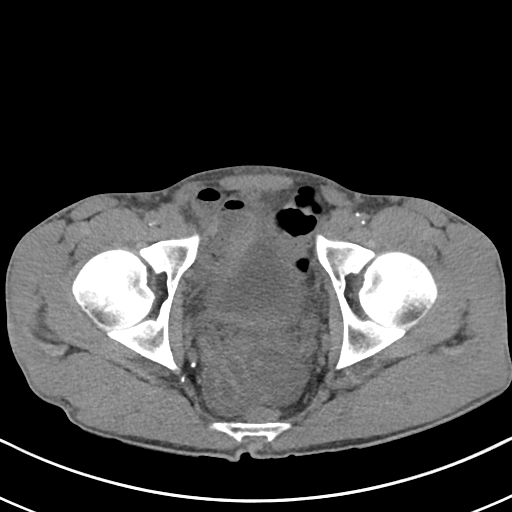
[im 24/83  soft-tissue]
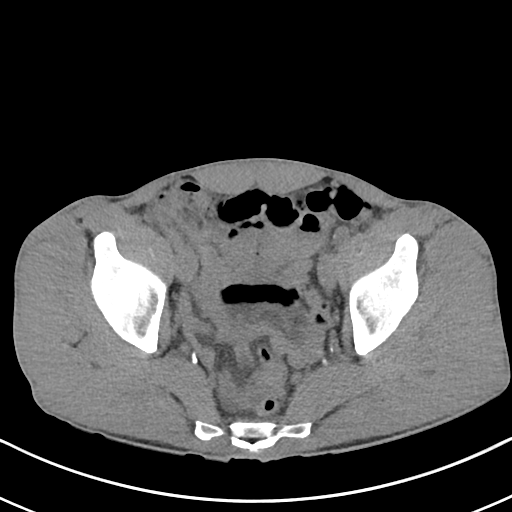
[im 28/83  soft-tissue]
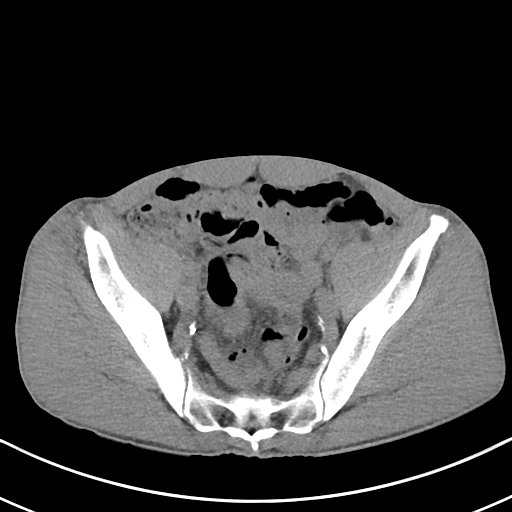
[im 35/83  soft-tissue]
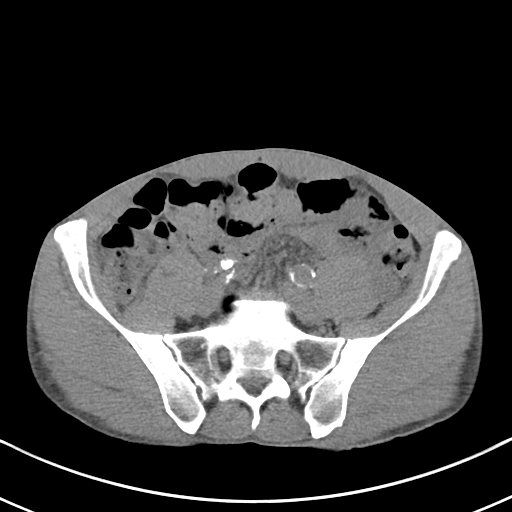
[im 42/83  soft-tissue]
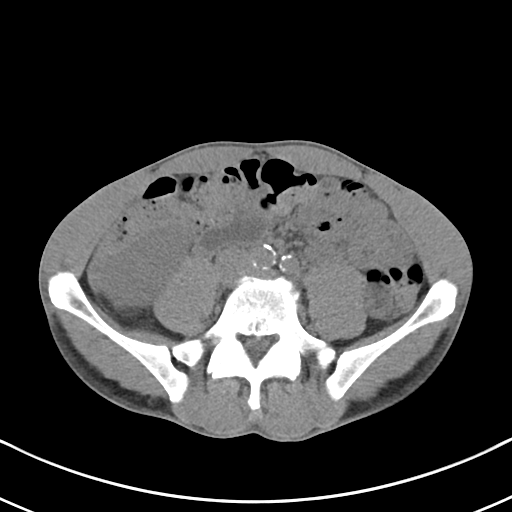
[im 48/83  soft-tissue]
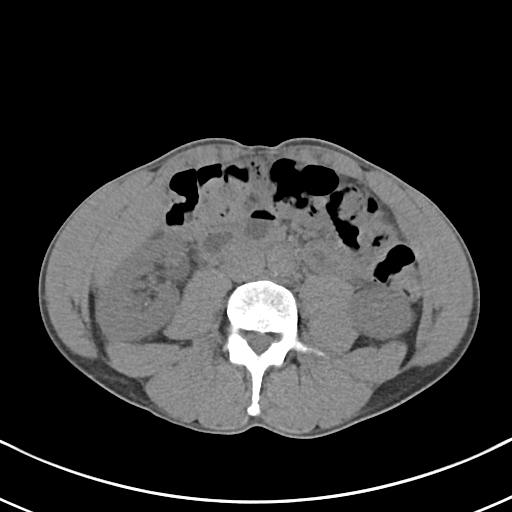
[im 55/83  soft-tissue]
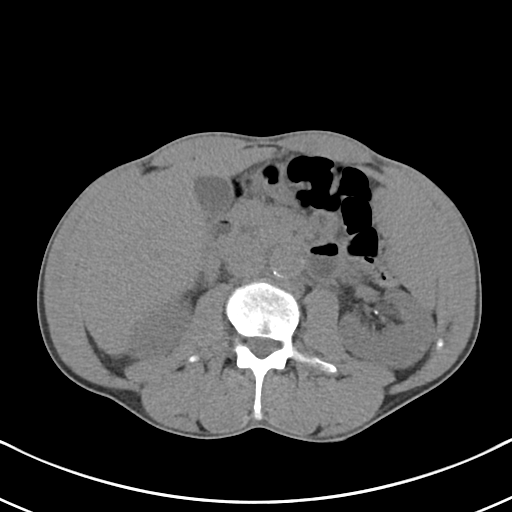
[im 55/83  bone]
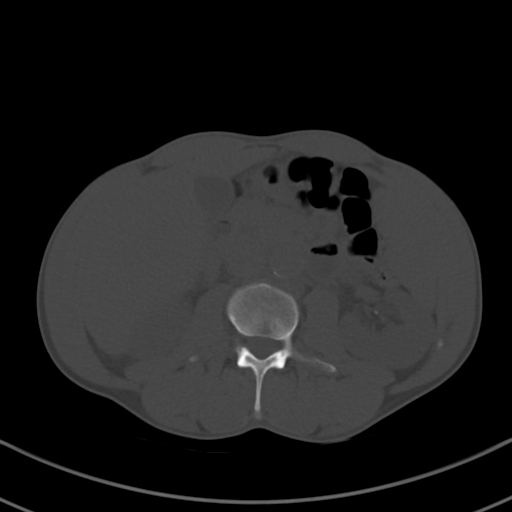
[im 59/83  soft-tissue]
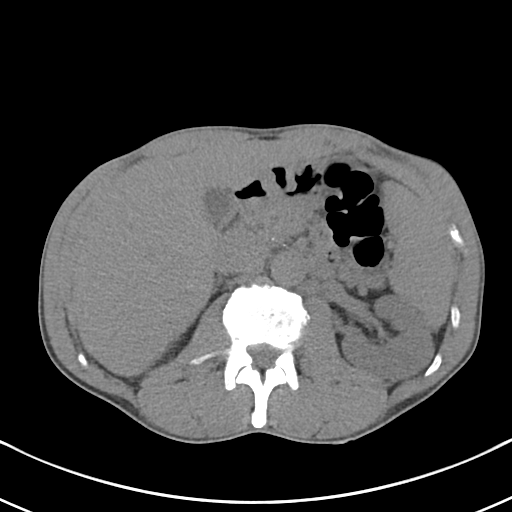
[im 65/83  soft-tissue]
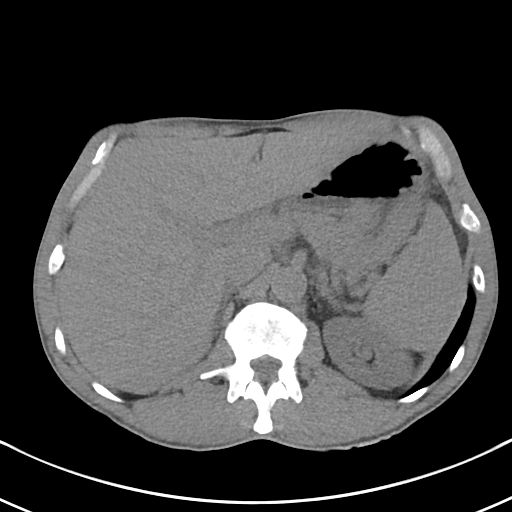
[im 72/83  soft-tissue]
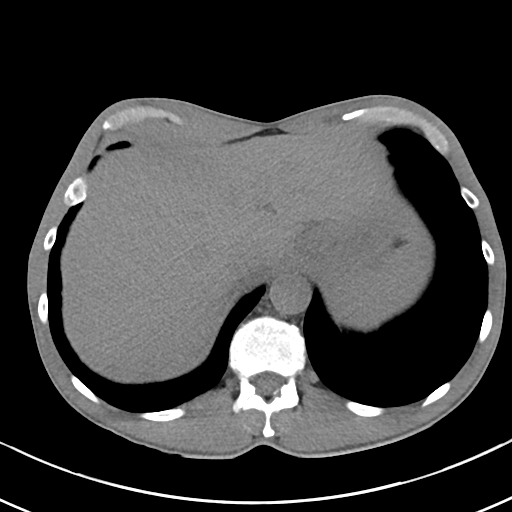
[im 79/83  soft-tissue]
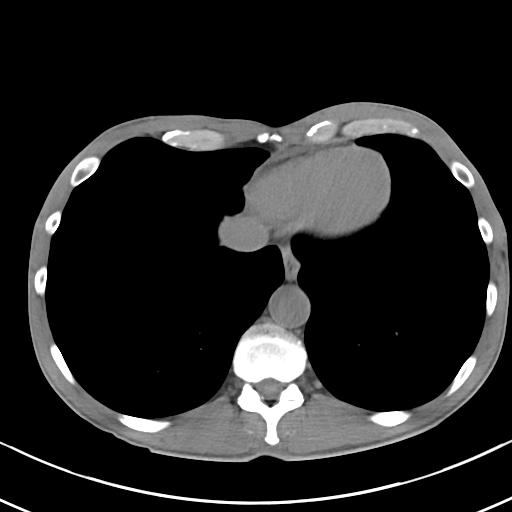

[Series 5: coronal · coronal · 0.67mm/px · 3 of 80 slices shown]
[im 27/80  soft-tissue]
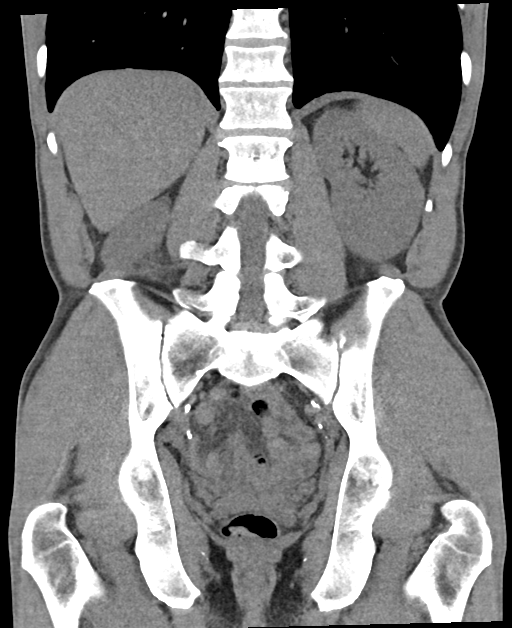
[im 36/80  soft-tissue]
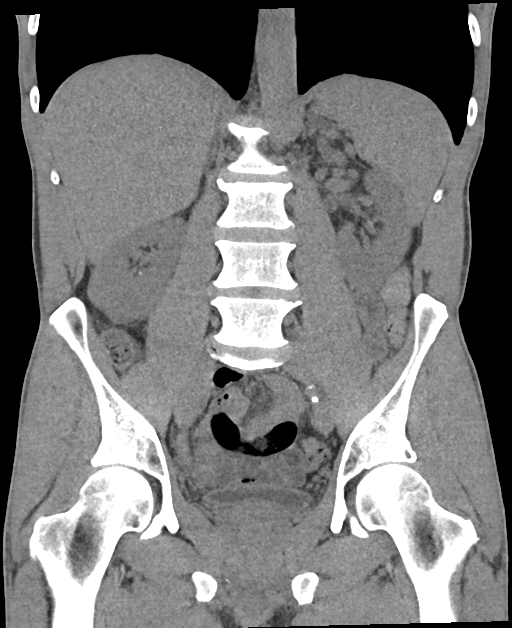
[im 44/80  soft-tissue]
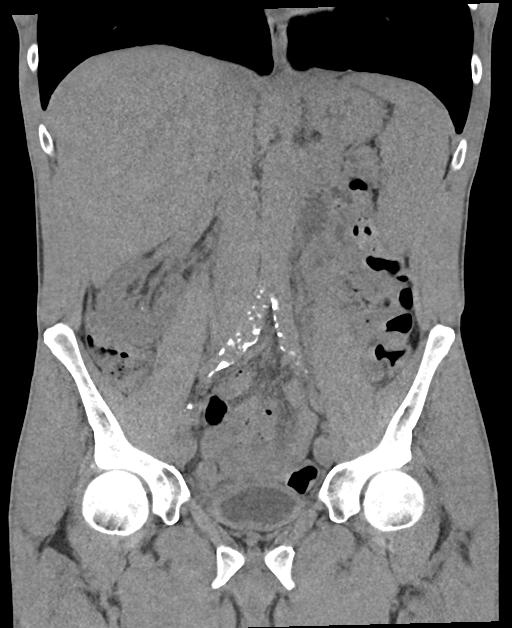

[16 of 46 positions shown; findings below may reference images not displayed]

FINDINGS: Lower chest: No pleural or pericardial effusion. Visualized lung
bases clear.

Hepatobiliary: No focal liver abnormality is seen. No gallstones,
gallbladder wall thickening, or biliary dilatation.

Pancreas: Scattered calcifications in the pancreatic neck and head
region, new since 6459. No pancreatic ductal dilatation or
surrounding inflammatory changes.

Spleen: Normal in size without focal abnormality.

Adrenals/Urinary Tract: Adrenal glands unremarkable. Bilateral
nephrolithiasis. Largest left 2 mm, mid collecting system. Scattered
right 1 mm calculi. No hydronephrosis. No definite urolithiasis or
ureterectasis. The urinary bladder is nondistended.

Stomach/Bowel: Stomach is nondistended. Small bowel is decompressed.
Appendix not discretely identified. The colon is nondilated,
unremarkable.

Vascular/Lymphatic: Scattered aortic and moderate iliofemoral
calcified plaque without aneurysm. No abdominal or pelvic
adenopathy.

Reproductive: Prostate is unremarkable.

Other: No ascites.  No free air.

Musculoskeletal: Mild multilevel spondylitic change in the lumbar
spine. No fracture or worrisome bone lesion.
IMPRESSION: 1. Bilateral nephrolithiasis without hydronephrosis.
2. Pancreatic parenchymal calcifications suggesting chronic
pancreatitis.
3.  Aortic Atherosclerosis (FS0TD-170.0).

## 2023-04-10 ENCOUNTER — Encounter (INDEPENDENT_AMBULATORY_CARE_PROVIDER_SITE_OTHER): Payer: Self-pay

## 2023-05-13 ENCOUNTER — Encounter: Payer: Self-pay | Admitting: Family Medicine

## 2023-05-13 ENCOUNTER — Ambulatory Visit (INDEPENDENT_AMBULATORY_CARE_PROVIDER_SITE_OTHER): Payer: PRIVATE HEALTH INSURANCE | Admitting: Family Medicine

## 2023-05-13 VITALS — BP 118/84 | HR 64 | Temp 98.1°F | Ht 68.0 in | Wt 140.8 lb

## 2023-05-13 DIAGNOSIS — E7800 Pure hypercholesterolemia, unspecified: Secondary | ICD-10-CM

## 2023-05-13 DIAGNOSIS — Z720 Tobacco use: Secondary | ICD-10-CM

## 2023-05-13 DIAGNOSIS — I2584 Coronary atherosclerosis due to calcified coronary lesion: Secondary | ICD-10-CM

## 2023-05-13 DIAGNOSIS — I2510 Atherosclerotic heart disease of native coronary artery without angina pectoris: Secondary | ICD-10-CM | POA: Diagnosis not present

## 2023-05-13 LAB — LIPID PANEL
Cholesterol: 151 mg/dL (ref 0–200)
HDL: 50.2 mg/dL (ref >39.00)
LDL Cholesterol: 83 mg/dL (ref 0–99)
NonHDL: 101.18
Total CHOL/HDL Ratio: 3
Triglycerides: 91 mg/dL (ref 0.0–149.0)
VLDL: 18.2 mg/dL (ref 0.0–40.0)

## 2023-05-13 MED ORDER — ATORVASTATIN CALCIUM 20 MG PO TABS: 20.0000 mg | ORAL_TABLET | Freq: Every day | ORAL | 3 refills | Status: DC

## 2023-05-13 NOTE — Addendum Note (Signed)
Addended by: Andrez Grime on: 05/13/2023 03:18 PM   Modules accepted: Orders

## 2023-05-13 NOTE — Progress Notes (Addendum)
Established Patient Office Visit   Subjective:  Patient ID: Larry Sparks, male    DOB: February 06, 1961  Age: 62 y.o. MRN: [id number]  Chief Complaint  Patient presents with   Medical Management of Chronic Issues    3 month follow up. Pt is fasting.     HPI Encounter Diagnoses  Name Primary?   Elevated LDL cholesterol level Yes   Coronary artery calcification    Tobacco abuse    For follow-up of above.  Discontinued Chantix secondary to hallucinations.  He is down to 5 cigarettes daily and continues his efforts to quit.  No issues with atorvastatin 10 mg daily.  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  Wife Larry Sparks is exhausted after work today.   Review of Systems  Constitutional: Negative.   HENT: Negative.    Eyes:  Negative for blurred vision, discharge and redness.  Respiratory: Negative.    Cardiovascular: Negative.   Gastrointestinal:  Negative for abdominal pain.  Genitourinary: Negative.   Musculoskeletal: Negative.  Negative for myalgias.  Skin:  Negative for rash.  Neurological:  Negative for tingling, loss of consciousness and weakness.  Endo/Heme/Allergies:  Negative for polydipsia.     Current Outpatient Medications:    atorvastatin (LIPITOR) 20 MG tablet, Take 1 tablet (20 mg total) by mouth daily., Disp: 90 tablet, Rfl: 3   varenicline (CHANTIX CONTINUING MONTH PAK) 1 MG tablet, Take 1 tablet (1 mg total) by mouth 2 (two) times daily. (Patient not taking: Reported on 05/13/2023), Disp: 60 tablet, Rfl: 2   Objective:     BP 118/84   Pulse 64   Temp 98.1 F (36.7 C)   Ht 5\' 8"  (1.727 m)   Wt 140 lb 12.8 oz (63.9 kg)   SpO2 98%   BMI 21.41 kg/m    Physical Exam Constitutional:      General: He is not in acute distress.    Appearance: Normal appearance. He is not ill-appearing, toxic-appearing or diaphoretic.  HENT:     Head: Normocephalic and atraumatic.     Right Ear: External ear normal.     Left Ear: External  ear normal.  Eyes:     General: No scleral icterus.       Right eye: No discharge.        Left eye: No discharge.     Extraocular Movements: Extraocular movements intact.     Conjunctiva/sclera: Conjunctivae normal.  Cardiovascular:     Rate and Rhythm: Normal rate and regular rhythm.  Pulmonary:     Effort: Pulmonary effort is normal. No respiratory distress.     Breath sounds: No wheezing, rhonchi or rales.  Skin:    General: Skin is warm and dry.  Neurological:     Mental Status: He is alert and oriented to person, place, and time.  Psychiatric:        Mood and Affect: Mood normal.        Behavior: Behavior normal.      Results for orders placed or performed in visit on 05/13/23  Lipid panel  Result Value Ref Range   Cholesterol 151 0 - 200 mg/dL   Triglycerides 40.9 0.0 - 149.0 mg/dL   HDL 81.19 >14.78 mg/dL   VLDL 29.5 0.0 - 62.1 mg/dL   LDL Cholesterol 83 0 - 99 mg/dL   Total CHOL/HDL Ratio 3    NonHDL 101.18       The 10-year ASCVD risk score (Arnett DK, et  al., 2019) is: 9.3%    Assessment & Plan:   Elevated LDL cholesterol level -     Lipid panel -     Atorvastatin Calcium; Take 1 tablet (20 mg total) by mouth daily.  Dispense: 90 tablet; Refill: 3  Coronary artery calcification -     Atorvastatin Calcium; Take 1 tablet (20 mg total) by mouth daily.  Dispense: 90 tablet; Refill: 3  Tobacco abuse    Return in about 6 months (around 11/10/2023), or Repeat screening CT of chest scheduled for January 26..  Asked him to consider using nicotine lozenges in place of a cigarette.  Advised that they have about the same amount of nicotine.  He will continue his efforts to quit smoking entirely.  Advised that our goal for the LDL cholesterol is 70 or better.  May need to increase the dose of atorvastatin.  Information was given on heart disease prevention.  Mliss Sax, MD  9/17 addendum: Have increased atorvastatin to 20 mg daily.

## 2023-05-20 ENCOUNTER — Ambulatory Visit: Payer: PRIVATE HEALTH INSURANCE | Admitting: Family Medicine

## 2023-08-11 ENCOUNTER — Other Ambulatory Visit: Payer: Self-pay | Admitting: Acute Care

## 2023-08-11 DIAGNOSIS — Z122 Encounter for screening for malignant neoplasm of respiratory organs: Secondary | ICD-10-CM

## 2023-08-11 DIAGNOSIS — F17210 Nicotine dependence, cigarettes, uncomplicated: Secondary | ICD-10-CM

## 2023-08-11 DIAGNOSIS — Z87891 Personal history of nicotine dependence: Secondary | ICD-10-CM

## 2023-09-22 ENCOUNTER — Ambulatory Visit
Admission: RE | Admit: 2023-09-22 | Discharge: 2023-09-22 | Disposition: A | Discharge status: Home / Self Care | Payer: PRIVATE HEALTH INSURANCE | Source: Ambulatory Visit | Attending: Acute Care | Admitting: Acute Care

## 2023-09-22 DIAGNOSIS — Z87891 Personal history of nicotine dependence: Secondary | ICD-10-CM

## 2023-09-22 DIAGNOSIS — Z122 Encounter for screening for malignant neoplasm of respiratory organs: Secondary | ICD-10-CM

## 2023-09-22 DIAGNOSIS — F17210 Nicotine dependence, cigarettes, uncomplicated: Secondary | ICD-10-CM

## 2023-10-03 ENCOUNTER — Other Ambulatory Visit: Payer: Self-pay

## 2023-10-03 DIAGNOSIS — F17210 Nicotine dependence, cigarettes, uncomplicated: Secondary | ICD-10-CM

## 2023-10-03 DIAGNOSIS — Z87891 Personal history of nicotine dependence: Secondary | ICD-10-CM

## 2023-10-03 DIAGNOSIS — Z122 Encounter for screening for malignant neoplasm of respiratory organs: Secondary | ICD-10-CM

## 2023-10-21 ENCOUNTER — Telehealth: Payer: Self-pay | Admitting: Family Medicine

## 2023-10-21 DIAGNOSIS — Z0279 Encounter for issue of other medical certificate: Secondary | ICD-10-CM

## 2023-10-21 NOTE — Telephone Encounter (Signed)
 Patient dropped off document DMV, to be filled out by provider. Patient requested to send it back via Call Patient to pick up within 7-days. Document is located in providers tray at front office.Please advise at Mobile [phone] (mobile)   Pt wife dropped off the paperwork.put in the dr box

## 2023-11-10 ENCOUNTER — Telehealth: Payer: Self-pay | Admitting: Family Medicine

## 2023-11-10 ENCOUNTER — Ambulatory Visit: Payer: PRIVATE HEALTH INSURANCE | Admitting: Family Medicine

## 2023-11-20 ENCOUNTER — Encounter: Payer: Self-pay | Admitting: Family Medicine

## 2023-11-20 ENCOUNTER — Ambulatory Visit: Payer: PRIVATE HEALTH INSURANCE | Admitting: Family Medicine

## 2023-11-20 VITALS — BP 118/80 | HR 87 | Temp 97.9°F | Ht 68.0 in | Wt 142.2 lb

## 2023-11-20 DIAGNOSIS — Z125 Encounter for screening for malignant neoplasm of prostate: Secondary | ICD-10-CM | POA: Diagnosis not present

## 2023-11-20 DIAGNOSIS — Z131 Encounter for screening for diabetes mellitus: Secondary | ICD-10-CM

## 2023-11-20 DIAGNOSIS — Z720 Tobacco use: Secondary | ICD-10-CM

## 2023-11-20 DIAGNOSIS — I2510 Atherosclerotic heart disease of native coronary artery without angina pectoris: Secondary | ICD-10-CM

## 2023-11-20 DIAGNOSIS — Z13220 Encounter for screening for lipoid disorders: Secondary | ICD-10-CM | POA: Diagnosis not present

## 2023-11-20 DIAGNOSIS — Z1211 Encounter for screening for malignant neoplasm of colon: Secondary | ICD-10-CM

## 2023-11-20 DIAGNOSIS — Z0000 Encounter for general adult medical examination without abnormal findings: Secondary | ICD-10-CM | POA: Diagnosis not present

## 2023-11-20 DIAGNOSIS — E7800 Pure hypercholesterolemia, unspecified: Secondary | ICD-10-CM

## 2023-11-20 LAB — COMPREHENSIVE METABOLIC PANEL WITH GFR
ALT: 18 U/L (ref 0–53)
AST: 17 U/L (ref 0–37)
Albumin: 4.9 g/dL (ref 3.5–5.2)
Alkaline Phosphatase: 102 U/L (ref 39–117)
BUN: 12 mg/dL (ref 6–23)
CO2: 29 meq/L (ref 19–32)
Calcium: 10.1 mg/dL (ref 8.4–10.5)
Chloride: 102 meq/L (ref 96–112)
Creatinine, Ser: 0.93 mg/dL (ref 0.40–1.50)
GFR: 88.8 mL/min (ref >60.00)
Glucose, Bld: 100 mg/dL — ABNORMAL HIGH (ref 70–99)
Potassium: 4.7 meq/L (ref 3.5–5.1)
Sodium: 137 meq/L (ref 135–145)
Total Bilirubin: 0.9 mg/dL (ref 0.2–1.2)
Total Protein: 7.2 g/dL (ref 6.0–8.3)

## 2023-11-20 LAB — LIPID PANEL
Cholesterol: 170 mg/dL (ref 0–200)
HDL: 52.6 mg/dL (ref >39.00)
LDL Cholesterol: 98 mg/dL (ref 0–99)
NonHDL: 117.36
Total CHOL/HDL Ratio: 3
Triglycerides: 95 mg/dL (ref 0.0–149.0)
VLDL: 19 mg/dL (ref 0.0–40.0)

## 2023-11-20 LAB — URINALYSIS, ROUTINE W REFLEX MICROSCOPIC
Bilirubin Urine: NEGATIVE
Hgb urine dipstick: NEGATIVE
Ketones, ur: NEGATIVE
Leukocytes,Ua: NEGATIVE
Nitrite: NEGATIVE
Specific Gravity, Urine: <=1.005 — AB (ref 1.000–1.030)
Total Protein, Urine: NEGATIVE
Urine Glucose: NEGATIVE
Urobilinogen, UA: 0.2 (ref 0.0–1.0)
pH: 6 (ref 5.0–8.0)

## 2023-11-20 LAB — CBC
HCT: 46.9 % (ref 39.0–52.0)
Hemoglobin: 15.7 g/dL (ref 13.0–17.0)
MCHC: 33.5 g/dL (ref 30.0–36.0)
MCV: 98.4 fl (ref 78.0–100.0)
Platelets: 270 10*3/uL (ref 150.0–400.0)
RBC: 4.77 Mil/uL (ref 4.22–5.81)
RDW: 13.4 % (ref 11.5–15.5)
WBC: 5.3 10*3/uL (ref 4.0–10.5)

## 2023-11-20 LAB — HEMOGLOBIN A1C: Hgb A1c MFr Bld: 5.7 % (ref 4.6–6.5)

## 2023-11-20 MED ORDER — ATORVASTATIN CALCIUM 20 MG PO TABS: 20.0000 mg | ORAL_TABLET | Freq: Every day | ORAL | 3 refills | Status: AC

## 2023-11-20 NOTE — Progress Notes (Signed)
 Established Patient Office Visit   Subjective:  Patient ID: Larry Sparks, male    DOB: 1961-06-08  Age: 63 y.o. MRN: [id number]  Chief Complaint  Patient presents with   Medical Management of Chronic Issues    6 month follow up. Pt is not fasting has coffee with sugar. No concerns.     HPI Encounter Diagnoses  Name Primary?   Healthcare maintenance Yes   Tobacco abuse    Screening for prostate cancer    Screening for cholesterol level    Screening for diabetes mellitus    Screening for colon cancer    Coronary artery calcification    Elevated LDL cholesterol level    Here for physical of above.  Ran out of atorvastatin and it was not refilled.  Continues to smoke about a half a pack a day.  He is quite active on his job.  A friend passed from colon cancer and he is ready to go have his first colonoscopy done.   Review of Systems  Constitutional: Negative.   HENT: Negative.    Eyes:  Negative for blurred vision, discharge and redness.  Respiratory: Negative.    Cardiovascular: Negative.   Gastrointestinal:  Negative for abdominal pain.  Genitourinary: Negative.   Musculoskeletal: Negative.  Negative for myalgias.  Skin:  Negative for rash.  Neurological:  Negative for tingling, loss of consciousness and weakness.  Endo/Heme/Allergies:  Negative for polydipsia.     Current Outpatient Medications:    atorvastatin (LIPITOR) 20 MG tablet, Take 1 tablet (20 mg total) by mouth daily., Disp: 90 tablet, Rfl: 3   Objective:     BP 118/80   Pulse 87   Temp 97.9 F (36.6 C)   Ht 5\' 8"  (1.727 m)   Wt 142 lb 3.2 oz (64.5 kg)   SpO2 97%   BMI 21.62 kg/m    Physical Exam Constitutional:      General: He is not in acute distress.    Appearance: Normal appearance. He is not ill-appearing, toxic-appearing or diaphoretic.  HENT:     Head: Normocephalic and atraumatic.     Right Ear: Tympanic membrane, ear canal and external ear normal.     Left Ear: Tympanic  membrane, ear canal and external ear normal.     Mouth/Throat:     Mouth: Mucous membranes are moist.     Pharynx: Oropharynx is clear. No oropharyngeal exudate or posterior oropharyngeal erythema.  Eyes:     General: No scleral icterus.       Right eye: No discharge.        Left eye: No discharge.     Extraocular Movements: Extraocular movements intact.     Conjunctiva/sclera: Conjunctivae normal.     Pupils: Pupils are equal, round, and reactive to light.  Cardiovascular:     Rate and Rhythm: Normal rate and regular rhythm.  Pulmonary:     Effort: Pulmonary effort is normal. No respiratory distress.     Breath sounds: Normal breath sounds. No wheezing, rhonchi or rales.  Abdominal:     General: Bowel sounds are normal.     Tenderness: There is no abdominal tenderness. There is no guarding or rebound.     Hernia: There is no hernia in the left inguinal area or right inguinal area.  Genitourinary:    Penis: Circumcised. No hypospadias, erythema, tenderness, discharge, swelling or lesions.      Testes:        Right: Mass, tenderness or swelling not  present. Right testis is descended.        Left: Mass, tenderness or swelling not present. Left testis is descended.     Epididymis:     Right: Not inflamed or enlarged.     Left: Not inflamed or enlarged.  Musculoskeletal:     Cervical back: No rigidity or tenderness.  Lymphadenopathy:     Lower Body: No right inguinal adenopathy. No left inguinal adenopathy.  Skin:    General: Skin is warm and dry.  Neurological:     Mental Status: He is alert and oriented to person, place, and time.  Psychiatric:        Mood and Affect: Mood normal.        Behavior: Behavior normal.      No results found for any visits on 11/20/23.    The 10-year ASCVD risk score (Arnett DK, et al., 2019) is: 9.9%    Assessment & Plan:   Healthcare maintenance -     CBC -     Urinalysis, Routine w reflex microscopic  Tobacco abuse  Screening for  prostate cancer -     PSA  Screening for cholesterol level -     Comprehensive metabolic panel with GFR -     Lipid panel  Screening for diabetes mellitus -     Comprehensive metabolic panel with GFR -     Hemoglobin A1c  Screening for colon cancer -     Ambulatory referral to Gastroenterology  Coronary artery calcification -     Atorvastatin Calcium; Take 1 tablet (20 mg total) by mouth daily.  Dispense: 90 tablet; Refill: 3  Elevated LDL cholesterol level -     Atorvastatin Calcium; Take 1 tablet (20 mg total) by mouth daily.  Dispense: 90 tablet; Refill: 3    Return in about 1 year (around 11/19/2024), or if symptoms worsen or fail to improve.  Strongly encouraged tobacco cessation.  Information was given on steps to take to quit smoking.  Information given on health maintenance and disease prevention.  Agrees to go for his first colonoscopy.  Encouraged him not to allow his atorvastatin prescription to lapse again.  Mliss Sax, MD

## 2023-11-21 LAB — PSA: PSA: 0.93 ng/mL (ref 0.10–4.00)

## 2023-11-28 NOTE — Telephone Encounter (Signed)
 11/10/2023 1st no show, pt has been seen, letter sent via [facility]

## 2024-09-15 ENCOUNTER — Telehealth: Payer: Self-pay | Admitting: Acute Care

## 2024-09-15 NOTE — Telephone Encounter (Signed)
 [facility] sent message stating LDCT not approved at their location on [address].  Per Silvano Mechanic, [facility], reschedule at a [facility] facility.  Left message for patient to call for rescheduling.

## 2024-09-22 ENCOUNTER — Inpatient Hospital Stay
Admission: RE | Admit: 2024-09-22 | Discharge: 2024-09-22 | Disposition: A | Payer: PRIVATE HEALTH INSURANCE | Source: Ambulatory Visit

## 2024-09-22 ENCOUNTER — Ambulatory Visit: Payer: PRIVATE HEALTH INSURANCE

## 2024-09-22 DIAGNOSIS — Z87891 Personal history of nicotine dependence: Secondary | ICD-10-CM

## 2024-09-22 DIAGNOSIS — F17210 Nicotine dependence, cigarettes, uncomplicated: Secondary | ICD-10-CM

## 2024-09-22 DIAGNOSIS — Z122 Encounter for screening for malignant neoplasm of respiratory organs: Secondary | ICD-10-CM

## 2024-09-27 ENCOUNTER — Other Ambulatory Visit: Payer: Self-pay

## 2024-09-27 DIAGNOSIS — Z87891 Personal history of nicotine dependence: Secondary | ICD-10-CM

## 2024-09-27 DIAGNOSIS — Z122 Encounter for screening for malignant neoplasm of respiratory organs: Secondary | ICD-10-CM

## 2024-09-27 DIAGNOSIS — F17210 Nicotine dependence, cigarettes, uncomplicated: Secondary | ICD-10-CM

## 2024-10-01 ENCOUNTER — Telehealth: Payer: Self-pay

## 2024-10-01 NOTE — Telephone Encounter (Signed)
 Copied from CRM [id number]. Topic: Clinical - Lab/Test Results >> Oct 01, 2024 12:08 PM Celestine FALCON wrote: Reason for CRM: Pt's wife Newell is calling for a call from the clinic to give the pt the results of the LCS on 09/22/2024. Please call the pt back at [phone].   Please advise

## 2024-10-01 NOTE — Telephone Encounter (Signed)
 Returned call. Patient is at work. Spouse is not on the DPR. I have sent a new link to the patient to access his Mychart to review his results. Pt to call back with any questions. Also advised a result letter was mailed last week but possibly delayed due to weather.

## 2024-11-22 ENCOUNTER — Encounter: Payer: PRIVATE HEALTH INSURANCE | Admitting: Family Medicine
